# Patient Record
Sex: Female | Born: 1935 | Race: White | Hispanic: No | Smoking: Never smoker
Health system: Southern US, Community
[De-identification: ages and names within clinical notes are randomized; demographics above are authoritative.]

## PROBLEM LIST (undated history)

## (undated) DIAGNOSIS — K224 Dyskinesia of esophagus: Secondary | ICD-10-CM

## (undated) DIAGNOSIS — I341 Nonrheumatic mitral (valve) prolapse: Secondary | ICD-10-CM

## (undated) DIAGNOSIS — I10 Essential (primary) hypertension: Secondary | ICD-10-CM

## (undated) DIAGNOSIS — D369 Benign neoplasm, unspecified site: Secondary | ICD-10-CM

## (undated) DIAGNOSIS — K219 Gastro-esophageal reflux disease without esophagitis: Secondary | ICD-10-CM

## (undated) DIAGNOSIS — M810 Age-related osteoporosis without current pathological fracture: Secondary | ICD-10-CM

## (undated) DIAGNOSIS — H409 Unspecified glaucoma: Secondary | ICD-10-CM

## (undated) DIAGNOSIS — K624 Stenosis of anus and rectum: Secondary | ICD-10-CM

## (undated) DIAGNOSIS — E785 Hyperlipidemia, unspecified: Secondary | ICD-10-CM

## (undated) DIAGNOSIS — K589 Irritable bowel syndrome without diarrhea: Secondary | ICD-10-CM

## (undated) DIAGNOSIS — M069 Rheumatoid arthritis, unspecified: Secondary | ICD-10-CM

## (undated) DIAGNOSIS — K5900 Constipation, unspecified: Secondary | ICD-10-CM

## (undated) DIAGNOSIS — I2510 Atherosclerotic heart disease of native coronary artery without angina pectoris: Secondary | ICD-10-CM

## (undated) HISTORY — DX: Dyskinesia of esophagus: K22.4

## (undated) HISTORY — DX: Nonrheumatic mitral (valve) prolapse: I34.1

## (undated) HISTORY — DX: Essential (primary) hypertension: I10

## (undated) HISTORY — DX: Hyperlipidemia, unspecified: E78.5

## (undated) HISTORY — PX: PARTIAL HYSTERECTOMY: SHX80

## (undated) HISTORY — DX: Atherosclerotic heart disease of native coronary artery without angina pectoris: I25.10

## (undated) HISTORY — DX: Unspecified glaucoma: H40.9

## (undated) HISTORY — DX: Stenosis of anus and rectum: K62.4

## (undated) HISTORY — DX: Gastro-esophageal reflux disease without esophagitis: K21.9

## (undated) HISTORY — PX: BREAST CYST EXCISION: SHX579

## (undated) HISTORY — PX: CARDIAC CATHETERIZATION: SHX172

## (undated) HISTORY — DX: Rheumatoid arthritis, unspecified: M06.9

## (undated) HISTORY — DX: Constipation, unspecified: K59.00

## (undated) HISTORY — DX: Age-related osteoporosis without current pathological fracture: M81.0

## (undated) HISTORY — PX: GALLBLADDER SURGERY: SHX652

## (undated) HISTORY — PX: PACEMAKER INSERTION: SHX728

## (undated) HISTORY — DX: Irritable bowel syndrome without diarrhea: K58.9

## (undated) HISTORY — DX: Benign neoplasm, unspecified site: D36.9

---

## 2000-01-31 HISTORY — PX: OTHER SURGICAL HISTORY: SHX169

## 2004-02-25 ENCOUNTER — Ambulatory Visit: Payer: Self-pay | Admitting: Internal Medicine

## 2005-04-20 ENCOUNTER — Ambulatory Visit: Payer: Self-pay | Admitting: Internal Medicine

## 2006-06-08 ENCOUNTER — Ambulatory Visit: Payer: Self-pay | Admitting: Internal Medicine

## 2007-06-19 ENCOUNTER — Ambulatory Visit: Payer: Self-pay | Admitting: Internal Medicine

## 2009-12-29 ENCOUNTER — Ambulatory Visit: Payer: Self-pay | Admitting: Internal Medicine

## 2009-12-30 ENCOUNTER — Ambulatory Visit: Payer: Self-pay | Admitting: Internal Medicine

## 2009-12-30 DIAGNOSIS — K5900 Constipation, unspecified: Secondary | ICD-10-CM | POA: Insufficient documentation

## 2009-12-31 ENCOUNTER — Telehealth (INDEPENDENT_AMBULATORY_CARE_PROVIDER_SITE_OTHER): Payer: Self-pay | Admitting: *Deleted

## 2010-01-03 ENCOUNTER — Encounter: Payer: Self-pay | Admitting: Internal Medicine

## 2010-01-03 DIAGNOSIS — K219 Gastro-esophageal reflux disease without esophagitis: Secondary | ICD-10-CM

## 2010-01-05 ENCOUNTER — Telehealth (INDEPENDENT_AMBULATORY_CARE_PROVIDER_SITE_OTHER): Payer: Self-pay

## 2010-01-11 ENCOUNTER — Ambulatory Visit (HOSPITAL_COMMUNITY)
Admission: RE | Admit: 2010-01-11 | Discharge: 2010-01-11 | Payer: Self-pay | Source: Home / Self Care | Attending: Internal Medicine | Admitting: Internal Medicine

## 2010-01-11 HISTORY — PX: ESOPHAGOGASTRODUODENOSCOPY: SHX1529

## 2010-01-11 HISTORY — PX: COLONOSCOPY: SHX174

## 2010-01-12 ENCOUNTER — Encounter: Payer: Self-pay | Admitting: Internal Medicine

## 2010-01-16 ENCOUNTER — Encounter: Payer: Self-pay | Admitting: Internal Medicine

## 2010-02-02 ENCOUNTER — Encounter: Payer: Self-pay | Admitting: Internal Medicine

## 2010-03-01 NOTE — Letter (Signed)
Summary: OP NOTE  OP NOTE   Imported By: Rexene Alberts 12/29/2009 09:49:45  _____________________________________________________________________  External Attachment:    Type:   Image     Comment:   External Document

## 2010-03-01 NOTE — Assessment & Plan Note (Signed)
Summary: 69YR F/U, GERD/CONSTIPATION/LAW   Visit Type:  Follow-up Visit Primary Care Provider:  howard  Chief Complaint:  1 year follow up- still having reflux sx.  History of Present Illness: Chronic constipation well managed with a cocktail including prune juice applesauce and fiber once the cereal each morning. She takes MiraLax twice daily as well. . This revealed a normal upper GI tract.  Normal screening colonoscopy in 1616.  77 -year-old lady with a history of nutcracker esophagus and GERD, chronic constipation returns to see me with a three-month history of retroxiphoid burning and aching from time to time. Initial episode in August of this year led to an emergency department visit. She had a nuclear stress test and a cardiology evaluation. Chest pain not felt to be secondary to coronary ischemia. She has a history of vasospastic angina. She does have 2 stents. She is on Cardizem. She has been started on low-dose prednisone for rheumatoid arthritis. It is also quite notable that she started on Fosamax around about the time of her chest symptoms started. She's been on this agent for approximately 3 months. She notes more difficulties on "Sunday when she's  in church and afterwards. Please note that she gets up at 2:00 in the morning every Sunday and takes her Fosamax; she does not lie  back down; she does drink a full glass of water. She denies  true odynaphagia, dysphagia early satiety melena or hematochezia.  Preventive Screening-Counseling & Management  Alcohol-Tobacco     Smoking Status: never  Current Medications (verified): 1)  Imdur 60 Mg Xr24h-Tab (Isosorbide Mononitrate) .... Two Times A Day 2)  Metoprolol Tartrate 25 Mg Tabs (Metoprolol Tartrate) .... 1/2 Two Times A Day 3)  Cardizem 30 Mg Tabs (Diltiazem Hcl) .... Two Times A Day 4)  Lisinopril 5 Mg Tabs (Lisinopril) .... Once Daily 5)  Aspirin Ec 325 Mg Tbec (Aspirin) .... Once Daily 6)  Mirapex 0.25 Mg Tabs (Pramipexole  Dihydrochloride) .... Once Daily 7)  Zoloft 50 Mg Tabs (Sertraline Hcl) .... Once Daily 8)  Travatan Z 0.004 % Soln (Travoprost) .... Once Daily 9)  Asopt Eye Drop .... Once Daily 10)  Nitrostat 0.4 Mg Subl (Nitroglycerin) .... As Needed 11)  Meclizine Hcl 12.5 Mg Tabs (Meclizine Hcl) .... Once Daily 12)  Prednisone 5 Mg Tabs (Prednisone) .... Once Daily 13)  Fosamax 70 Mg Tabs (Alendronate Sodium) .... Q Week 14)  Protonix 40 Mg Tbec (Pantoprazole Sodium) .... Once Daily  Allergies (verified): 1)  ! Darvocet 2)  ! Pcn 3)  ! Codeine 4)  ! Amoxicillin 5)  ! Morphine 6)  ! Sulfa 7)  ! Phenergan 8)  ! Antivert (Meclizine Hcl) 9)  ! Benadryl  Past History:  Past Medical History: GERD Glaucoma Hyperlipidemia Hypertension Irritable Bowel Syndrome nutcracker esophagus constipation osteoporosis anal stenosis CAD angina mitral valve prolapse rhumatoid arthritis  Past Surgical History: tcs 2002 pacemaker heart cath gallbladder partial hyst breast cyst  Family History: Father: deceased Mother:deceased   Siblings: 2 deceased brothers 1 living brother, 3 sisters No FH of Colon Cancer:  Social History: Marital Status: widow Children: 2 Occupation: retired Patient has never smoked.  Alcohol Use - no Smoking Status:  never  Vital Signs:  Patient profile:   77 year old female Height:      61 inches Weight:      115 pounds BMI:     21.81 Temp:     97" .9 degrees F oral Pulse rate:   60 / minute BP  sitting:   100 / 64  (left arm) Cuff size:   regular  Vitals Entered By: Hendricks Limes LPN (December 29, 2009 2:02 PM)  Physical Exam  General:  very pleasant well-groomed well oriented conversant 77 year old lady resting comfortably Eyes:  no scleral icterus. Conjunctiva pink Lungs:  clear to auscultation Heart:  regular rate and rhythm without murmur gallop rub Abdomen:  nondistended. Positive bowel sounds soft and nontender without appreciable mass or  organomegaly  Impression & Recommendations: Impression: 77 year old lady with recent retroxiphoid chest discomfort in a  setting of known nutcracker esophagus and history of GERD. Although her recent worsening retroxiphoid symptoms barely predated the initiation of Fosamax therapy, the coincidence is striking. She tends to have more symptoms on the same day she takes Fosamax than any other time. Even though she is not having any true odynophagia or dysphagia, I'm concerned that Fosamax may be causing some of her symptoms. She's really already on treatment for nutcracker esophagus via cardiezem. Her gallbladder is out. I doubt her symptoms are emanating from below the diaphragm.  I do not detect any alarm symptoms at this time.  Recommendations: Stop Fosamax for the next month.  Continue Protonix 40 mg orally daily.  Add Carafate suspension 1 g q.i.d. x7 days  One immunofecal occult blood test on stool.  This nice lady is asked to call me back in one month and let me know how she is doing. If she is markedly improved, then I would plan to switch her to a parenteral bisphosphonate agent. If not, then I'll will recommend a diagnostic EGD.  Further recommendations to follow.  Appended Document: Orders Update    Clinical Lists Changes  Problems: Added new problem of GERD (ICD-530.81) Orders: Added new Service order of Est. Patient Level III ([address]109) - Signed

## 2010-03-01 NOTE — Assessment & Plan Note (Signed)
Summary: DROPPED OFF STOOL/SS  pt returned ifobt and it was positive  Allergies: 1)  ! Darvocet 2)  ! Pcn 3)  ! Codeine 4)  ! Amoxicillin 5)  ! Morphine 6)  ! Sulfa 7)  ! Phenergan 8)  ! Antivert (Meclizine Hcl) 9)  ! Benadryl  Other Orders: Immuno-chemical Fecal Occult (04540)

## 2010-03-01 NOTE — Letter (Signed)
Summary: TCS/EGD ORDER  TCS/EGD ORDER   Imported By: Ave Filter 01/03/2010 10:13:22  _____________________________________________________________________  External Attachment:    Type:   Image     Comment:   External Document

## 2010-03-01 NOTE — Progress Notes (Signed)
  Phone Note Call from Patient   Reason for Call: Talk to Nurse, Lab or Test Results Summary of Call: Patient has called numerous times wanting results. Please return her call ASAP on Monday morning. Initial call taken by: Diana Eves,  December 31, 2009 4:32 PM     Appended Document:  occult blood positive; needs a tcs now and possibly an EGD to follow  Appended Document:  pt aware  Appended Document:  Pt is scheduled for procedure 01/03/10@11 :15a.m.Pt aware of appt.

## 2010-03-01 NOTE — Progress Notes (Signed)
Summary: progress report from pt  Phone Note Call from Patient Call back at [facility] Phone [phone]   Caller: Patient Summary of Call: pt called with PR- she stated she is feeling better- on a scale of 1-10 she feels like a 9 or 9.5. Today is her last day of taking Carafate. She is scheduled for tcs next tuesday. Initial call taken by: Hendricks Limes LPN,  January 05, 2010 12:26 PM     Appended Document: progress report from pt noted

## 2010-03-03 NOTE — Letter (Signed)
Summary: CLINIC NOTE FROM [facility]  CLINIC NOTE FROM [facility]   Imported By: Rexene Alberts 02/02/2010 09:53:16  _____________________________________________________________________  External Attachment:    Type:   Image     Comment:   External Document

## 2010-03-03 NOTE — Letter (Signed)
Summary: CLINIC NOTE FROM DR [facility] NOTE FROM DR Kellie Simmering   Imported By: Rexene Alberts 01/12/2010 09:50:57  _____________________________________________________________________  External Attachment:    Type:   Image     Comment:   External Document

## 2010-03-03 NOTE — Letter (Signed)
Summary: Patient Notice, Endo Biopsy Results  [facility]  [address]   [place], [place] [address]   Phone: [phone]  Fax: [phone]       January 16, 2010   LUCRETIA PENDLEY [address] [place], [place]  [address] [birth year]    Dear Ms. Beadle,  I am pleased to inform you that the biopsies taken during your recent endoscopic examination did not show any evidence of cancer upon pathologic examination. There was some mild inflammation in your stomach.  The poyp removed from your colon was also benign.  Additional information/recommendations:  Continue with the treatment plan as outlined on the day of your exam.  Please call us if you are having persistent problems or have questions about your condition that have not been fully answered at this time.  Have a Merry Christmas.  Sincerely,    R. Roetta Sessions MD, FACP [facility] Ph: [phone]   Fax: [phone]   Appended Document: Patient Notice, Endo Biopsy Results letter mailed to pt

## 2010-04-12 ENCOUNTER — Encounter: Payer: Self-pay | Admitting: Internal Medicine

## 2010-04-13 ENCOUNTER — Encounter (INDEPENDENT_AMBULATORY_CARE_PROVIDER_SITE_OTHER): Payer: Self-pay | Admitting: *Deleted

## 2010-04-19 NOTE — Medication Information (Signed)
Summary: CARAFATE  CARAFATE   Imported By: Rexene Alberts 04/12/2010 14:05:52  _____________________________________________________________________  External Attachment:    Type:   Image     Comment:   External Document

## 2010-04-19 NOTE — Letter (Signed)
Summary: Recall Office Visit  [facility]  [address]   [place], [place] [address]   Phone: [phone]  Fax: [phone]      April 13, 2010   Deanna LECOMPTE [address] [place], [place]  [address] Jan 19, 1923   Dear Ms. Hamada,   According to our records, it is time for you to schedule a follow-up office visit with [place].   At your convenience, please call [phone] to schedule an office visit. If you have any questions, concerns, or feel that this letter is in error, we would appreciate your call.   Sincerely,    Diana Eves  [facility] Ph: [phone]   Fax: [phone]

## 2010-06-14 NOTE — Assessment & Plan Note (Signed)
NAMEEMMETT, ARNTZ                 CHART#:  [id number]   DATE:  06/19/2007                       DOB:  1922/08/12   REASON FOR VISIT:  Nutcracker esophagus, GERD, constipation.  Last seen  Jun 08, 2006.   HISTORY:  Ms. Kapler has done very well from a GI standpoint over the  past 1 year.  Reflux symptoms well controlled on Protonix 40 mg orally  daily.  She describes her appetite as being very good.  She is not  having any dysphagia.  She has gained 4.5 pounds since she was last seen  1 year ago.  She moves her bowels 2-3 times daily with the help of  Colace and MiraLax q.h.s. on a p.r.n. basis.  She tells me in August she  developed chest pain and was diagnosed with acute coronary syndrome and  was transferred from [facility] ED to  [facility] where she spent a couple  days and describes having a dobutamine echo.  She tells me that she did  not have a heart attack.  She did not require any percutaneous  intervention and she has done well since that time.  She describes  things going very well for her.  She is very active.  She gets up every  morning and goes to [facility] even at 5 a.m. and walks the perimeter of  the store 6 times and browses for an hour before she goes home and  starts the rest of her day   I note her last colonoscopy was in 2002.  She had some anal stenosis and  melanosis coli only.  There is no family history of GI neoplasia.   CURRENT MEDICATIONS:  See updated list.   ALLERGIES:  1. DARVOCET.  2. PENICILLIN.  3. CODEINE.  4. AMOXICILLIN.  5. MORPHINE.  6. SULFA.  7. PHENERGAN.  8. ANTIVERT.  9. BENADRYL.   PHYSICAL EXAMINATION:  GENERAL:  On examination today, she looks very  well.  VITAL SIGNS:  Weight 110.5, height 5 feet 1 inch, temperature 97.9, BP  112/78, pulse 72.  SKIN:  Warm and dry.  ABDOMEN:  Flat, positive bowel sounds, soft, nontender.  No appreciable  mass or organomegaly.   ASSESSMENT:  1. Esophageal motility disorder/nutcracker,  gastroesophageal reflux      disease.  Symptoms quiesced on Protonix 40 mg orally daily.  2. Constipation, not an active problem now with MiraLax and stool      softeners.   RECOMMENDATIONS:  1. Continue Protonix 40 mg orally daily indefinitely.  2. Continue her bowel regimen which she is currently on unless      something comes up.  3. I plan to see this nice lady back in 1 year.       Jonathon Bellows, M.D.  Electronically Signed     RMR/MEDQ  D:  06/19/2007  T:  06/19/2007  Job:  [id number]   cc:   Selinda Flavin

## 2010-06-14 NOTE — Assessment & Plan Note (Signed)
Deanna Villegas, Deanna Villegas                 CHART#:  [id number]   DATE:  06/08/2006                       DOB:  Nov 04, 1922   GERD/esophageal motility disorder, non-cardiac chest pain, constipation  last seen 1 year ago.  Overall, Deanna Villegas has done extremely well on  Nexium 40 mg orally daily.  She dropped back to every other day, but  that was not satisfactory.  She is also taking Librax q.i.d. She is  occasionally constipated, takes GlycoLax and stool softeners and does  very well.  She is really not having any dysphagia currently.  She has  lost 5 lb, but is trying to lose a little weight.  Has not had any  nausea or vomiting or food impaction clinically, no melena or rectal  bleeding.  Last colonoscopy 2002 demonstrated melanosis coli.  Overall,  it seems she is doing very well and she tells me that osteoporosis  therapy is being considered and she is worried about what the pills  might do to her esophagus.   MEDICATIONS:  See updated listing.   ALLERGIES:  DARVOCET, PENICILLIN, CODEINE, AMOXICILLIN, PHENERGAN,  MORPHINE SULFA, ANTIVERT, BENADRYL.   PHYSICAL EXAMINATION:  GENERAL:  Overall this lady appears at her  baseline.  She is pleasant and appears well today.  VITAL SIGNS:  Weight 106, height 5 feet 1 inches, temperature 97, blood  pressure 114/80, pulse 50.  CHEST:  Lungs are clear to auscultation.  COR:  Regular rate and rhythm without murmurs, gallops or rubs.  ABDOMEN:  Nondistended.  Positive bowel sounds.  Soft and nontender  without appreciable mass or hepatosplenomegaly.   ASSESSMENT:  1. Esophageal motility disorder/gastroesophageal reflux disease.      Symptoms quiescent on Nexium 40 mg orally daily.  2. Constipation, being well controlled on GlycoLax and stool      softeners.   RECOMMENDATIONS:  1. I feel [name] ought to stay on Nexium 40 mg orally daily      indefinitely.  Theoretical risks of this medication are far      outweighed by the clinical benefit  we are seeing.  2. If alendronate therapy  is contemplated would prefer her to be on      an injectable agent.  3. Unless something comes up, I plan to see this nice lady back in 1      year.  Prescription samples for Nexium given.       Jonathon Bellows, M.D.  Electronically Signed     RMR/MEDQ  D:  06/08/2006  T:  06/08/2006  Job:  [id number]   cc:   Selinda Flavin

## 2011-01-09 DIAGNOSIS — E785 Hyperlipidemia, unspecified: Secondary | ICD-10-CM | POA: Insufficient documentation

## 2011-01-09 DIAGNOSIS — I10 Essential (primary) hypertension: Secondary | ICD-10-CM | POA: Insufficient documentation

## 2011-01-09 DIAGNOSIS — Z950 Presence of cardiac pacemaker: Secondary | ICD-10-CM | POA: Insufficient documentation

## 2011-01-09 DIAGNOSIS — I771 Stricture of artery: Secondary | ICD-10-CM | POA: Insufficient documentation

## 2011-01-09 DIAGNOSIS — I2510 Atherosclerotic heart disease of native coronary artery without angina pectoris: Secondary | ICD-10-CM | POA: Insufficient documentation

## 2011-02-01 DIAGNOSIS — J438 Other emphysema: Secondary | ICD-10-CM | POA: Diagnosis not present

## 2011-02-03 DIAGNOSIS — M542 Cervicalgia: Secondary | ICD-10-CM | POA: Diagnosis not present

## 2011-02-03 DIAGNOSIS — M545 Low back pain: Secondary | ICD-10-CM | POA: Diagnosis not present

## 2011-02-06 DIAGNOSIS — M542 Cervicalgia: Secondary | ICD-10-CM | POA: Diagnosis not present

## 2011-02-06 DIAGNOSIS — M545 Low back pain: Secondary | ICD-10-CM | POA: Diagnosis not present

## 2011-02-08 DIAGNOSIS — M545 Low back pain: Secondary | ICD-10-CM | POA: Diagnosis not present

## 2011-02-08 DIAGNOSIS — M542 Cervicalgia: Secondary | ICD-10-CM | POA: Diagnosis not present

## 2011-02-10 DIAGNOSIS — M545 Low back pain: Secondary | ICD-10-CM | POA: Diagnosis not present

## 2011-02-10 DIAGNOSIS — M542 Cervicalgia: Secondary | ICD-10-CM | POA: Diagnosis not present

## 2011-02-14 DIAGNOSIS — I10 Essential (primary) hypertension: Secondary | ICD-10-CM | POA: Diagnosis not present

## 2011-02-14 DIAGNOSIS — E7800 Pure hypercholesterolemia, unspecified: Secondary | ICD-10-CM | POA: Diagnosis not present

## 2011-02-15 DIAGNOSIS — M542 Cervicalgia: Secondary | ICD-10-CM | POA: Diagnosis not present

## 2011-02-15 DIAGNOSIS — M545 Low back pain: Secondary | ICD-10-CM | POA: Diagnosis not present

## 2011-02-17 DIAGNOSIS — M542 Cervicalgia: Secondary | ICD-10-CM | POA: Diagnosis not present

## 2011-02-17 DIAGNOSIS — M545 Low back pain: Secondary | ICD-10-CM | POA: Diagnosis not present

## 2011-02-20 DIAGNOSIS — M542 Cervicalgia: Secondary | ICD-10-CM | POA: Diagnosis not present

## 2011-02-20 DIAGNOSIS — M545 Low back pain: Secondary | ICD-10-CM | POA: Diagnosis not present

## 2011-02-21 DIAGNOSIS — I209 Angina pectoris, unspecified: Secondary | ICD-10-CM | POA: Diagnosis not present

## 2011-02-21 DIAGNOSIS — K219 Gastro-esophageal reflux disease without esophagitis: Secondary | ICD-10-CM | POA: Diagnosis not present

## 2011-02-21 DIAGNOSIS — J438 Other emphysema: Secondary | ICD-10-CM | POA: Diagnosis not present

## 2011-02-21 DIAGNOSIS — E7800 Pure hypercholesterolemia, unspecified: Secondary | ICD-10-CM | POA: Diagnosis not present

## 2011-02-21 DIAGNOSIS — I10 Essential (primary) hypertension: Secondary | ICD-10-CM | POA: Diagnosis not present

## 2011-02-21 DIAGNOSIS — F329 Major depressive disorder, single episode, unspecified: Secondary | ICD-10-CM | POA: Diagnosis not present

## 2011-02-21 DIAGNOSIS — M129 Arthropathy, unspecified: Secondary | ICD-10-CM | POA: Diagnosis not present

## 2011-02-23 DIAGNOSIS — M545 Low back pain: Secondary | ICD-10-CM | POA: Diagnosis not present

## 2011-02-23 DIAGNOSIS — M542 Cervicalgia: Secondary | ICD-10-CM | POA: Diagnosis not present

## 2011-03-01 DIAGNOSIS — M542 Cervicalgia: Secondary | ICD-10-CM | POA: Diagnosis not present

## 2011-03-01 DIAGNOSIS — M545 Low back pain: Secondary | ICD-10-CM | POA: Diagnosis not present

## 2011-03-06 DIAGNOSIS — M542 Cervicalgia: Secondary | ICD-10-CM | POA: Diagnosis not present

## 2011-03-06 DIAGNOSIS — M545 Low back pain: Secondary | ICD-10-CM | POA: Diagnosis not present

## 2011-03-12 DIAGNOSIS — H409 Unspecified glaucoma: Secondary | ICD-10-CM | POA: Insufficient documentation

## 2011-03-12 DIAGNOSIS — H401190 Primary open-angle glaucoma, unspecified eye, stage unspecified: Secondary | ICD-10-CM | POA: Insufficient documentation

## 2011-03-13 DIAGNOSIS — H4011X0 Primary open-angle glaucoma, stage unspecified: Secondary | ICD-10-CM | POA: Diagnosis not present

## 2011-03-13 DIAGNOSIS — H409 Unspecified glaucoma: Secondary | ICD-10-CM | POA: Diagnosis not present

## 2011-03-22 DIAGNOSIS — I10 Essential (primary) hypertension: Secondary | ICD-10-CM | POA: Diagnosis not present

## 2011-03-23 DIAGNOSIS — I442 Atrioventricular block, complete: Secondary | ICD-10-CM | POA: Diagnosis not present

## 2011-04-24 DIAGNOSIS — Z79899 Other long term (current) drug therapy: Secondary | ICD-10-CM | POA: Diagnosis not present

## 2011-04-24 DIAGNOSIS — M542 Cervicalgia: Secondary | ICD-10-CM | POA: Diagnosis not present

## 2011-04-24 DIAGNOSIS — M25519 Pain in unspecified shoulder: Secondary | ICD-10-CM | POA: Diagnosis not present

## 2011-04-28 DIAGNOSIS — H4011X0 Primary open-angle glaucoma, stage unspecified: Secondary | ICD-10-CM | POA: Diagnosis not present

## 2011-05-15 DIAGNOSIS — Z79899 Other long term (current) drug therapy: Secondary | ICD-10-CM | POA: Diagnosis not present

## 2011-05-15 DIAGNOSIS — M899 Disorder of bone, unspecified: Secondary | ICD-10-CM | POA: Diagnosis not present

## 2011-05-15 DIAGNOSIS — M949 Disorder of cartilage, unspecified: Secondary | ICD-10-CM | POA: Diagnosis not present

## 2011-05-15 DIAGNOSIS — M542 Cervicalgia: Secondary | ICD-10-CM | POA: Diagnosis not present

## 2011-05-15 DIAGNOSIS — M353 Polymyalgia rheumatica: Secondary | ICD-10-CM | POA: Diagnosis not present

## 2011-05-17 DIAGNOSIS — H4011X0 Primary open-angle glaucoma, stage unspecified: Secondary | ICD-10-CM | POA: Diagnosis not present

## 2011-05-17 DIAGNOSIS — H409 Unspecified glaucoma: Secondary | ICD-10-CM | POA: Diagnosis not present

## 2011-05-18 DIAGNOSIS — M129 Arthropathy, unspecified: Secondary | ICD-10-CM | POA: Diagnosis not present

## 2011-05-18 DIAGNOSIS — F329 Major depressive disorder, single episode, unspecified: Secondary | ICD-10-CM | POA: Diagnosis not present

## 2011-05-18 DIAGNOSIS — I209 Angina pectoris, unspecified: Secondary | ICD-10-CM | POA: Diagnosis not present

## 2011-05-18 DIAGNOSIS — K219 Gastro-esophageal reflux disease without esophagitis: Secondary | ICD-10-CM | POA: Diagnosis not present

## 2011-05-18 DIAGNOSIS — J438 Other emphysema: Secondary | ICD-10-CM | POA: Diagnosis not present

## 2011-05-18 DIAGNOSIS — I10 Essential (primary) hypertension: Secondary | ICD-10-CM | POA: Diagnosis not present

## 2011-05-18 DIAGNOSIS — M353 Polymyalgia rheumatica: Secondary | ICD-10-CM | POA: Diagnosis not present

## 2011-05-18 DIAGNOSIS — E7800 Pure hypercholesterolemia, unspecified: Secondary | ICD-10-CM | POA: Diagnosis not present

## 2011-05-24 DIAGNOSIS — H02059 Trichiasis without entropian unspecified eye, unspecified eyelid: Secondary | ICD-10-CM | POA: Diagnosis not present

## 2011-06-07 DIAGNOSIS — IMO0002 Reserved for concepts with insufficient information to code with codable children: Secondary | ICD-10-CM | POA: Diagnosis not present

## 2011-06-07 DIAGNOSIS — M129 Arthropathy, unspecified: Secondary | ICD-10-CM | POA: Diagnosis not present

## 2011-06-22 DIAGNOSIS — I442 Atrioventricular block, complete: Secondary | ICD-10-CM | POA: Diagnosis not present

## 2011-06-22 DIAGNOSIS — Z45018 Encounter for adjustment and management of other part of cardiac pacemaker: Secondary | ICD-10-CM | POA: Diagnosis not present

## 2011-06-29 DIAGNOSIS — H409 Unspecified glaucoma: Secondary | ICD-10-CM | POA: Diagnosis not present

## 2011-06-29 DIAGNOSIS — H4011X0 Primary open-angle glaucoma, stage unspecified: Secondary | ICD-10-CM | POA: Diagnosis not present

## 2011-07-10 DIAGNOSIS — Z880 Allergy status to penicillin: Secondary | ICD-10-CM | POA: Diagnosis not present

## 2011-07-10 DIAGNOSIS — Z882 Allergy status to sulfonamides status: Secondary | ICD-10-CM | POA: Diagnosis not present

## 2011-07-10 DIAGNOSIS — Z888 Allergy status to other drugs, medicaments and biological substances status: Secondary | ICD-10-CM | POA: Diagnosis not present

## 2011-07-10 DIAGNOSIS — Z883 Allergy status to other anti-infective agents status: Secondary | ICD-10-CM | POA: Diagnosis not present

## 2011-07-10 DIAGNOSIS — Z950 Presence of cardiac pacemaker: Secondary | ICD-10-CM | POA: Diagnosis not present

## 2011-07-10 DIAGNOSIS — I509 Heart failure, unspecified: Secondary | ICD-10-CM | POA: Diagnosis not present

## 2011-07-10 DIAGNOSIS — Z9861 Coronary angioplasty status: Secondary | ICD-10-CM | POA: Diagnosis not present

## 2011-07-10 DIAGNOSIS — R079 Chest pain, unspecified: Secondary | ICD-10-CM | POA: Diagnosis not present

## 2011-07-10 DIAGNOSIS — I2510 Atherosclerotic heart disease of native coronary artery without angina pectoris: Secondary | ICD-10-CM | POA: Diagnosis not present

## 2011-07-10 DIAGNOSIS — K219 Gastro-esophageal reflux disease without esophagitis: Secondary | ICD-10-CM | POA: Diagnosis not present

## 2011-07-10 DIAGNOSIS — Z79899 Other long term (current) drug therapy: Secondary | ICD-10-CM | POA: Diagnosis not present

## 2011-07-10 DIAGNOSIS — I442 Atrioventricular block, complete: Secondary | ICD-10-CM | POA: Diagnosis not present

## 2011-07-10 DIAGNOSIS — Z9109 Other allergy status, other than to drugs and biological substances: Secondary | ICD-10-CM | POA: Diagnosis not present

## 2011-07-10 DIAGNOSIS — Z45018 Encounter for adjustment and management of other part of cardiac pacemaker: Secondary | ICD-10-CM | POA: Diagnosis not present

## 2011-07-10 DIAGNOSIS — E7800 Pure hypercholesterolemia, unspecified: Secondary | ICD-10-CM | POA: Diagnosis not present

## 2011-07-10 DIAGNOSIS — Z7982 Long term (current) use of aspirin: Secondary | ICD-10-CM | POA: Diagnosis not present

## 2011-07-10 DIAGNOSIS — I10 Essential (primary) hypertension: Secondary | ICD-10-CM | POA: Diagnosis not present

## 2011-07-10 DIAGNOSIS — Z885 Allergy status to narcotic agent status: Secondary | ICD-10-CM | POA: Diagnosis not present

## 2011-07-17 DIAGNOSIS — H4011X0 Primary open-angle glaucoma, stage unspecified: Secondary | ICD-10-CM | POA: Diagnosis not present

## 2011-07-17 DIAGNOSIS — H409 Unspecified glaucoma: Secondary | ICD-10-CM | POA: Diagnosis not present

## 2011-07-21 DIAGNOSIS — Z1231 Encounter for screening mammogram for malignant neoplasm of breast: Secondary | ICD-10-CM | POA: Diagnosis not present

## 2011-07-24 DIAGNOSIS — Z79899 Other long term (current) drug therapy: Secondary | ICD-10-CM | POA: Diagnosis not present

## 2011-07-24 DIAGNOSIS — M542 Cervicalgia: Secondary | ICD-10-CM | POA: Diagnosis not present

## 2011-07-24 DIAGNOSIS — M353 Polymyalgia rheumatica: Secondary | ICD-10-CM | POA: Diagnosis not present

## 2011-07-24 DIAGNOSIS — M810 Age-related osteoporosis without current pathological fracture: Secondary | ICD-10-CM | POA: Diagnosis not present

## 2011-07-24 DIAGNOSIS — L570 Actinic keratosis: Secondary | ICD-10-CM | POA: Diagnosis not present

## 2011-07-24 DIAGNOSIS — Z85828 Personal history of other malignant neoplasm of skin: Secondary | ICD-10-CM | POA: Diagnosis not present

## 2011-08-01 DIAGNOSIS — I10 Essential (primary) hypertension: Secondary | ICD-10-CM | POA: Diagnosis not present

## 2011-08-01 DIAGNOSIS — E7800 Pure hypercholesterolemia, unspecified: Secondary | ICD-10-CM | POA: Diagnosis not present

## 2011-08-09 DIAGNOSIS — I10 Essential (primary) hypertension: Secondary | ICD-10-CM | POA: Diagnosis not present

## 2011-08-09 DIAGNOSIS — M353 Polymyalgia rheumatica: Secondary | ICD-10-CM | POA: Diagnosis not present

## 2011-08-09 DIAGNOSIS — E7800 Pure hypercholesterolemia, unspecified: Secondary | ICD-10-CM | POA: Diagnosis not present

## 2011-08-09 DIAGNOSIS — G4700 Insomnia, unspecified: Secondary | ICD-10-CM | POA: Diagnosis not present

## 2011-09-12 DIAGNOSIS — L259 Unspecified contact dermatitis, unspecified cause: Secondary | ICD-10-CM | POA: Diagnosis not present

## 2011-09-12 DIAGNOSIS — F411 Generalized anxiety disorder: Secondary | ICD-10-CM | POA: Diagnosis not present

## 2011-09-21 DIAGNOSIS — I442 Atrioventricular block, complete: Secondary | ICD-10-CM | POA: Diagnosis not present

## 2011-09-28 DIAGNOSIS — H60399 Other infective otitis externa, unspecified ear: Secondary | ICD-10-CM | POA: Diagnosis not present

## 2011-10-06 DIAGNOSIS — R5383 Other fatigue: Secondary | ICD-10-CM | POA: Diagnosis not present

## 2011-10-06 DIAGNOSIS — E538 Deficiency of other specified B group vitamins: Secondary | ICD-10-CM | POA: Diagnosis not present

## 2011-10-09 DIAGNOSIS — R5381 Other malaise: Secondary | ICD-10-CM | POA: Diagnosis not present

## 2011-10-16 DIAGNOSIS — M67919 Unspecified disorder of synovium and tendon, unspecified shoulder: Secondary | ICD-10-CM | POA: Diagnosis not present

## 2011-10-16 DIAGNOSIS — M545 Low back pain: Secondary | ICD-10-CM | POA: Diagnosis not present

## 2011-10-16 DIAGNOSIS — M719 Bursopathy, unspecified: Secondary | ICD-10-CM | POA: Diagnosis not present

## 2011-10-16 DIAGNOSIS — M353 Polymyalgia rheumatica: Secondary | ICD-10-CM | POA: Diagnosis not present

## 2011-10-18 DIAGNOSIS — Z85828 Personal history of other malignant neoplasm of skin: Secondary | ICD-10-CM | POA: Diagnosis not present

## 2011-10-18 DIAGNOSIS — L570 Actinic keratosis: Secondary | ICD-10-CM | POA: Diagnosis not present

## 2011-10-18 DIAGNOSIS — Z23 Encounter for immunization: Secondary | ICD-10-CM | POA: Diagnosis not present

## 2011-10-20 DIAGNOSIS — J449 Chronic obstructive pulmonary disease, unspecified: Secondary | ICD-10-CM | POA: Diagnosis not present

## 2011-11-09 DIAGNOSIS — H903 Sensorineural hearing loss, bilateral: Secondary | ICD-10-CM | POA: Diagnosis not present

## 2011-11-13 DIAGNOSIS — H409 Unspecified glaucoma: Secondary | ICD-10-CM | POA: Diagnosis not present

## 2011-11-13 DIAGNOSIS — H4011X0 Primary open-angle glaucoma, stage unspecified: Secondary | ICD-10-CM | POA: Diagnosis not present

## 2011-11-20 DIAGNOSIS — E039 Hypothyroidism, unspecified: Secondary | ICD-10-CM | POA: Diagnosis not present

## 2011-11-21 DIAGNOSIS — E7800 Pure hypercholesterolemia, unspecified: Secondary | ICD-10-CM | POA: Diagnosis not present

## 2011-11-21 DIAGNOSIS — I10 Essential (primary) hypertension: Secondary | ICD-10-CM | POA: Diagnosis not present

## 2011-11-21 DIAGNOSIS — M129 Arthropathy, unspecified: Secondary | ICD-10-CM | POA: Diagnosis not present

## 2011-11-21 DIAGNOSIS — M353 Polymyalgia rheumatica: Secondary | ICD-10-CM | POA: Diagnosis not present

## 2011-11-21 DIAGNOSIS — I209 Angina pectoris, unspecified: Secondary | ICD-10-CM | POA: Diagnosis not present

## 2011-12-20 DIAGNOSIS — Z85828 Personal history of other malignant neoplasm of skin: Secondary | ICD-10-CM | POA: Diagnosis not present

## 2011-12-20 DIAGNOSIS — L570 Actinic keratosis: Secondary | ICD-10-CM | POA: Diagnosis not present

## 2011-12-20 DIAGNOSIS — L03019 Cellulitis of unspecified finger: Secondary | ICD-10-CM | POA: Diagnosis not present

## 2011-12-21 DIAGNOSIS — I442 Atrioventricular block, complete: Secondary | ICD-10-CM | POA: Diagnosis not present

## 2012-01-08 DIAGNOSIS — I2510 Atherosclerotic heart disease of native coronary artery without angina pectoris: Secondary | ICD-10-CM | POA: Diagnosis not present

## 2012-01-09 DIAGNOSIS — H02059 Trichiasis without entropian unspecified eye, unspecified eyelid: Secondary | ICD-10-CM | POA: Diagnosis not present

## 2012-01-15 DIAGNOSIS — K219 Gastro-esophageal reflux disease without esophagitis: Secondary | ICD-10-CM | POA: Diagnosis not present

## 2012-01-15 DIAGNOSIS — I10 Essential (primary) hypertension: Secondary | ICD-10-CM | POA: Diagnosis not present

## 2012-01-15 DIAGNOSIS — M129 Arthropathy, unspecified: Secondary | ICD-10-CM | POA: Diagnosis not present

## 2012-01-15 DIAGNOSIS — E7800 Pure hypercholesterolemia, unspecified: Secondary | ICD-10-CM | POA: Diagnosis not present

## 2012-01-15 DIAGNOSIS — H4011X0 Primary open-angle glaucoma, stage unspecified: Secondary | ICD-10-CM | POA: Diagnosis not present

## 2012-01-18 DIAGNOSIS — M719 Bursopathy, unspecified: Secondary | ICD-10-CM | POA: Diagnosis not present

## 2012-01-18 DIAGNOSIS — I209 Angina pectoris, unspecified: Secondary | ICD-10-CM | POA: Diagnosis not present

## 2012-01-18 DIAGNOSIS — M67919 Unspecified disorder of synovium and tendon, unspecified shoulder: Secondary | ICD-10-CM | POA: Diagnosis not present

## 2012-01-18 DIAGNOSIS — M129 Arthropathy, unspecified: Secondary | ICD-10-CM | POA: Diagnosis not present

## 2012-01-18 DIAGNOSIS — M353 Polymyalgia rheumatica: Secondary | ICD-10-CM | POA: Diagnosis not present

## 2012-01-18 DIAGNOSIS — E039 Hypothyroidism, unspecified: Secondary | ICD-10-CM | POA: Diagnosis not present

## 2012-01-18 DIAGNOSIS — Z79899 Other long term (current) drug therapy: Secondary | ICD-10-CM | POA: Diagnosis not present

## 2012-01-18 DIAGNOSIS — E7800 Pure hypercholesterolemia, unspecified: Secondary | ICD-10-CM | POA: Diagnosis not present

## 2012-01-18 DIAGNOSIS — M545 Low back pain: Secondary | ICD-10-CM | POA: Diagnosis not present

## 2012-01-18 DIAGNOSIS — I10 Essential (primary) hypertension: Secondary | ICD-10-CM | POA: Diagnosis not present

## 2012-02-12 DIAGNOSIS — H409 Unspecified glaucoma: Secondary | ICD-10-CM | POA: Diagnosis not present

## 2012-02-12 DIAGNOSIS — H4011X0 Primary open-angle glaucoma, stage unspecified: Secondary | ICD-10-CM | POA: Diagnosis not present

## 2012-02-27 DIAGNOSIS — H4011X0 Primary open-angle glaucoma, stage unspecified: Secondary | ICD-10-CM | POA: Diagnosis not present

## 2012-02-27 DIAGNOSIS — H409 Unspecified glaucoma: Secondary | ICD-10-CM | POA: Diagnosis not present

## 2012-02-27 DIAGNOSIS — E785 Hyperlipidemia, unspecified: Secondary | ICD-10-CM | POA: Diagnosis not present

## 2012-02-27 DIAGNOSIS — I2510 Atherosclerotic heart disease of native coronary artery without angina pectoris: Secondary | ICD-10-CM | POA: Diagnosis not present

## 2012-02-27 DIAGNOSIS — I10 Essential (primary) hypertension: Secondary | ICD-10-CM | POA: Diagnosis not present

## 2012-02-27 DIAGNOSIS — Z45018 Encounter for adjustment and management of other part of cardiac pacemaker: Secondary | ICD-10-CM | POA: Diagnosis not present

## 2012-02-27 DIAGNOSIS — Z45010 Encounter for checking and testing of cardiac pacemaker pulse generator [battery]: Secondary | ICD-10-CM | POA: Insufficient documentation

## 2012-02-28 DIAGNOSIS — Z45018 Encounter for adjustment and management of other part of cardiac pacemaker: Secondary | ICD-10-CM | POA: Diagnosis not present

## 2012-02-28 DIAGNOSIS — E785 Hyperlipidemia, unspecified: Secondary | ICD-10-CM | POA: Diagnosis not present

## 2012-02-28 DIAGNOSIS — H409 Unspecified glaucoma: Secondary | ICD-10-CM | POA: Diagnosis not present

## 2012-02-28 DIAGNOSIS — I2510 Atherosclerotic heart disease of native coronary artery without angina pectoris: Secondary | ICD-10-CM | POA: Diagnosis not present

## 2012-02-28 DIAGNOSIS — I10 Essential (primary) hypertension: Secondary | ICD-10-CM | POA: Diagnosis not present

## 2012-02-28 DIAGNOSIS — H4011X0 Primary open-angle glaucoma, stage unspecified: Secondary | ICD-10-CM | POA: Diagnosis not present

## 2012-02-29 DIAGNOSIS — I2510 Atherosclerotic heart disease of native coronary artery without angina pectoris: Secondary | ICD-10-CM | POA: Diagnosis not present

## 2012-02-29 DIAGNOSIS — I10 Essential (primary) hypertension: Secondary | ICD-10-CM | POA: Diagnosis not present

## 2012-02-29 DIAGNOSIS — Z45018 Encounter for adjustment and management of other part of cardiac pacemaker: Secondary | ICD-10-CM | POA: Diagnosis not present

## 2012-02-29 DIAGNOSIS — E785 Hyperlipidemia, unspecified: Secondary | ICD-10-CM | POA: Diagnosis not present

## 2012-02-29 DIAGNOSIS — H4011X0 Primary open-angle glaucoma, stage unspecified: Secondary | ICD-10-CM | POA: Diagnosis not present

## 2012-02-29 DIAGNOSIS — H409 Unspecified glaucoma: Secondary | ICD-10-CM | POA: Diagnosis not present

## 2012-03-18 DIAGNOSIS — E039 Hypothyroidism, unspecified: Secondary | ICD-10-CM | POA: Diagnosis not present

## 2012-04-19 DIAGNOSIS — R079 Chest pain, unspecified: Secondary | ICD-10-CM | POA: Diagnosis not present

## 2012-04-19 DIAGNOSIS — Z79899 Other long term (current) drug therapy: Secondary | ICD-10-CM | POA: Diagnosis not present

## 2012-04-19 DIAGNOSIS — I10 Essential (primary) hypertension: Secondary | ICD-10-CM | POA: Diagnosis not present

## 2012-04-19 DIAGNOSIS — I442 Atrioventricular block, complete: Secondary | ICD-10-CM | POA: Diagnosis not present

## 2012-04-19 DIAGNOSIS — Z950 Presence of cardiac pacemaker: Secondary | ICD-10-CM | POA: Diagnosis not present

## 2012-04-19 DIAGNOSIS — Z45018 Encounter for adjustment and management of other part of cardiac pacemaker: Secondary | ICD-10-CM | POA: Diagnosis not present

## 2012-04-19 DIAGNOSIS — Z7982 Long term (current) use of aspirin: Secondary | ICD-10-CM | POA: Diagnosis not present

## 2012-04-19 DIAGNOSIS — R0602 Shortness of breath: Secondary | ICD-10-CM | POA: Diagnosis not present

## 2012-04-19 DIAGNOSIS — J449 Chronic obstructive pulmonary disease, unspecified: Secondary | ICD-10-CM | POA: Diagnosis not present

## 2012-04-24 DIAGNOSIS — J438 Other emphysema: Secondary | ICD-10-CM | POA: Diagnosis not present

## 2012-04-24 DIAGNOSIS — R05 Cough: Secondary | ICD-10-CM | POA: Diagnosis not present

## 2012-04-25 DIAGNOSIS — H4011X0 Primary open-angle glaucoma, stage unspecified: Secondary | ICD-10-CM | POA: Diagnosis not present

## 2012-05-09 DIAGNOSIS — M545 Low back pain: Secondary | ICD-10-CM | POA: Diagnosis not present

## 2012-05-09 DIAGNOSIS — Z79899 Other long term (current) drug therapy: Secondary | ICD-10-CM | POA: Diagnosis not present

## 2012-05-09 DIAGNOSIS — M353 Polymyalgia rheumatica: Secondary | ICD-10-CM | POA: Diagnosis not present

## 2012-05-10 DIAGNOSIS — J449 Chronic obstructive pulmonary disease, unspecified: Secondary | ICD-10-CM | POA: Diagnosis not present

## 2012-06-11 ENCOUNTER — Encounter: Payer: Self-pay | Admitting: Cardiology

## 2012-06-11 ENCOUNTER — Institutional Professional Consult (permissible substitution): Payer: Self-pay | Admitting: Cardiovascular Disease

## 2012-06-19 DIAGNOSIS — Z85828 Personal history of other malignant neoplasm of skin: Secondary | ICD-10-CM | POA: Diagnosis not present

## 2012-06-19 DIAGNOSIS — L03019 Cellulitis of unspecified finger: Secondary | ICD-10-CM | POA: Diagnosis not present

## 2012-06-19 DIAGNOSIS — L570 Actinic keratosis: Secondary | ICD-10-CM | POA: Diagnosis not present

## 2012-06-27 ENCOUNTER — Encounter: Payer: Self-pay | Admitting: Cardiovascular Disease

## 2012-07-01 DIAGNOSIS — H4011X0 Primary open-angle glaucoma, stage unspecified: Secondary | ICD-10-CM | POA: Diagnosis not present

## 2012-07-01 DIAGNOSIS — H409 Unspecified glaucoma: Secondary | ICD-10-CM | POA: Diagnosis not present

## 2012-07-15 DIAGNOSIS — I2510 Atherosclerotic heart disease of native coronary artery without angina pectoris: Secondary | ICD-10-CM | POA: Diagnosis not present

## 2012-07-15 DIAGNOSIS — I10 Essential (primary) hypertension: Secondary | ICD-10-CM | POA: Diagnosis not present

## 2012-07-15 DIAGNOSIS — Z950 Presence of cardiac pacemaker: Secondary | ICD-10-CM | POA: Diagnosis not present

## 2012-07-22 DIAGNOSIS — Z1231 Encounter for screening mammogram for malignant neoplasm of breast: Secondary | ICD-10-CM | POA: Diagnosis not present

## 2012-08-05 DIAGNOSIS — M353 Polymyalgia rheumatica: Secondary | ICD-10-CM | POA: Diagnosis not present

## 2012-08-05 DIAGNOSIS — M542 Cervicalgia: Secondary | ICD-10-CM | POA: Diagnosis not present

## 2012-08-05 DIAGNOSIS — M545 Low back pain: Secondary | ICD-10-CM | POA: Diagnosis not present

## 2012-09-12 DIAGNOSIS — M129 Arthropathy, unspecified: Secondary | ICD-10-CM | POA: Diagnosis not present

## 2012-09-12 DIAGNOSIS — I10 Essential (primary) hypertension: Secondary | ICD-10-CM | POA: Diagnosis not present

## 2012-09-12 DIAGNOSIS — K219 Gastro-esophageal reflux disease without esophagitis: Secondary | ICD-10-CM | POA: Diagnosis not present

## 2012-09-12 DIAGNOSIS — E7800 Pure hypercholesterolemia, unspecified: Secondary | ICD-10-CM | POA: Diagnosis not present

## 2012-09-12 DIAGNOSIS — E039 Hypothyroidism, unspecified: Secondary | ICD-10-CM | POA: Diagnosis not present

## 2012-09-12 DIAGNOSIS — F411 Generalized anxiety disorder: Secondary | ICD-10-CM | POA: Diagnosis not present

## 2012-09-19 DIAGNOSIS — I10 Essential (primary) hypertension: Secondary | ICD-10-CM | POA: Diagnosis not present

## 2012-09-19 DIAGNOSIS — E7800 Pure hypercholesterolemia, unspecified: Secondary | ICD-10-CM | POA: Diagnosis not present

## 2012-09-19 DIAGNOSIS — E039 Hypothyroidism, unspecified: Secondary | ICD-10-CM | POA: Diagnosis not present

## 2012-09-19 DIAGNOSIS — F329 Major depressive disorder, single episode, unspecified: Secondary | ICD-10-CM | POA: Diagnosis not present

## 2012-09-19 DIAGNOSIS — F411 Generalized anxiety disorder: Secondary | ICD-10-CM | POA: Diagnosis not present

## 2012-09-26 DIAGNOSIS — Z23 Encounter for immunization: Secondary | ICD-10-CM | POA: Diagnosis not present

## 2012-09-27 DIAGNOSIS — R300 Dysuria: Secondary | ICD-10-CM | POA: Diagnosis not present

## 2012-10-05 DIAGNOSIS — R300 Dysuria: Secondary | ICD-10-CM | POA: Diagnosis not present

## 2012-11-05 ENCOUNTER — Encounter: Payer: Self-pay | Admitting: Internal Medicine

## 2012-11-05 ENCOUNTER — Ambulatory Visit (INDEPENDENT_AMBULATORY_CARE_PROVIDER_SITE_OTHER): Payer: Medicare Other | Admitting: Internal Medicine

## 2012-11-05 VITALS — BP 134/66 | HR 89 | Temp 97.6°F | Ht 60.0 in | Wt 112.4 lb

## 2012-11-05 DIAGNOSIS — R140 Abdominal distension (gaseous): Secondary | ICD-10-CM | POA: Insufficient documentation

## 2012-11-05 DIAGNOSIS — R141 Gas pain: Secondary | ICD-10-CM

## 2012-11-05 DIAGNOSIS — K5900 Constipation, unspecified: Secondary | ICD-10-CM

## 2012-11-05 NOTE — Progress Notes (Signed)
Primary Care Physician:  Selinda Flavin, MD Primary Gastroenterologist:  Dr. Jena Gauss  Pre-Procedure History & Physical: HPI:  Deanna Villegas is a 79 y.o. female here for followup of GERD and abdominal bloating/constipation. Lifelong chronic constipation. A little better with a course of Bactrim for UTI recently. Hasn't had to take MiraLax. Normally takes MiraLax at times has to suppository in place water and rectum to facilitate a bowel movement. More abdominal bloating recently. No nausea or vomiting. No abdominal pain or fever. Occasional flare reflux symptoms in spite of protonix 40 mg daily. No dysphagia.  Pancolonic diverticulosis and small adenoma on prior colonoscopy. No Barrett's esophagus on prior EGD. No longer taking any oral bisphosphonate therapy  Past Medical History  Diagnosis Date  . GERD (gastroesophageal reflux disease)   . Glaucoma   . Hyperlipidemia   . Hypertension   . Irritable bowel   . Nutcracker esophagus   . Constipation   . Osteoporosis   . Anal stenosis   . CAD (coronary artery disease)   . Mitral valve prolapse   . Rheumatoid arthritis(714.0)   . Esophageal motility disorder   . Tubular adenoma     Past Surgical History  Procedure Laterality Date  . Tcs  2002    melanosis coli  . Pacemaker insertion    . Cardiac catheterization    . Gallbladder surgery    . Partial hysterectomy    . Breast cyst excision    . Colonoscopy  01/11/10    Dr. Jena Gauss- normal rectum, colonic diverticulosis, tubular adenoma  . Esophagogastroduodenoscopy  01/11/10    Dr. Jena Gauss-  normal esophagus, antral erosions bx= eroded and reactive gastric antral mucosa    Prior to Admission medications   Medication Sig Start Date End Date Taking? Authorizing Provider  aspirin 325 MG tablet Take 325 mg by mouth daily.   Yes Historical Provider, MD  brinzolamide (AZOPT) 1 % ophthalmic suspension Place 1 drop into both eyes 3 (three) times daily.   Yes Historical Provider, MD   calcium-vitamin D (OSCAL WITH D) 500-200 MG-UNIT per tablet Take 1 tablet by mouth daily with breakfast.   Yes Historical Provider, MD  COENZYME Q-10 PO Take 10 mg by mouth daily.   Yes Historical Provider, MD  diltiazem (CARDIZEM) 30 MG tablet Take 30 mg by mouth 2 (two) times daily.   Yes Historical Provider, MD  docusate sodium (COLACE) 100 MG capsule Take 100 mg by mouth 2 (two) times daily.   Yes Historical Provider, MD  fish oil-omega-3 fatty acids 1000 MG capsule Take 2 g by mouth daily.   Yes Historical Provider, MD  furosemide (LASIX) 20 MG tablet Take 20 mg by mouth daily.   Yes Historical Provider, MD  ipratropium (ATROVENT) 0.02 % nebulizer solution Take 500 mcg by nebulization 4 (four) times daily.   Yes Historical Provider, MD  isosorbide mononitrate (IMDUR) 60 MG 24 hr tablet Take 60 mg by mouth 2 (two) times daily.   Yes Historical Provider, MD  levothyroxine (SYNTHROID, LEVOTHROID) 75 MCG tablet Take 75 mcg by mouth daily before breakfast.   Yes Historical Provider, MD  lisinopril (PRINIVIL,ZESTRIL) 5 MG tablet Take 5 mg by mouth daily.   Yes Historical Provider, MD  metoprolol tartrate (LOPRESSOR) 25 MG tablet Take 12.5 mg by mouth 2 (two) times daily.   Yes Historical Provider, MD  nitroGLYCERIN (NITROSTAT) 0.4 MG SL tablet Place 0.4 mg under the tongue every 5 (five) minutes as needed for chest pain.   Yes Historical  Provider, MD  pantoprazole (PROTONIX) 40 MG tablet Take 40 mg by mouth daily.   Yes Historical Provider, MD  pilocarpine (PILOCAR) 2 % ophthalmic solution Place 1 drop into both eyes 4 (four) times daily.   Yes Historical Provider, MD  predniSONE (DELTASONE) 5 MG tablet Take 5 mg by mouth every other day.   Yes Historical Provider, MD  sertraline (ZOLOFT) 50 MG tablet Take 50 mg by mouth daily.   Yes Historical Provider, MD  simvastatin (ZOCOR) 10 MG tablet Take 10 mg by mouth at bedtime.   Yes Historical Provider, MD  temazepam (RESTORIL) 15 MG capsule Take 15 mg  by mouth at bedtime as needed for sleep.   Yes Historical Provider, MD  travoprost, benzalkonium, (TRAVATAN) 0.004 % ophthalmic solution Place 1 drop into both eyes at bedtime.   Yes Historical Provider, MD    Allergies as of 11/05/2012 - Review Complete 11/05/2012  Allergen Reaction Noted  . Amoxicillin    . Codeine    . Diphenhydramine hcl    . Meclizine hcl    . Morphine    . Penicillins    . Promethazine hcl    . Propoxyphene-acetaminophen    . Sulfonamide derivatives      History reviewed. No pertinent family history.  History   Social History  . Marital Status: Widowed    Spouse Name: N/A    Number of Children: N/A  . Years of Education: N/A   Occupational History  . Not on file.   Social History Main Topics  . Smoking status: Never Smoker   . Smokeless tobacco: Not on file  . Alcohol Use: No  . Drug Use: No  . Sexual Activity: Not on file   Other Topics Concern  . Not on file   Social History Narrative  . No narrative on file    Review of Systems: See HPI, otherwise negative ROS  Physical Exam: BP 134/66  Pulse 89  Temp(Src) 97.6 F (36.4 C) (Oral)  Ht 5' (1.524 m)  Wt 112 lb 6.4 oz (50.984 kg)  BMI 21.95 kg/m2 General:   Very pleasant somewhat frail somewhat hard of hearing elderly lady , pleasant and cooperative in NAD Skin:  Intact without significant lesions or rashes. Eyes:  Sclera clear, no icterus.   Conjunctiva pink. Ears:  Normal auditory acuity. Nose:  No deformity, discharge,  or lesions. Mouth:  No deformity or lesions. Neck:  Supple; no masses or thyromegaly. No significant cervical adenopathy. Lungs:  Clear throughout to auscultation.   No wheezes, crackles, or rhonchi. No acute distress. Heart:  Regular rate and rhythm; no murmurs, clicks, rubs,  or gallops. Abdomen: Non-distended, normal bowel sounds.  Soft and nontender without appreciable mass or hepatosplenomegaly.  Pulses:  Normal pulses noted. Extremities:  Without clubbing  or edema.  Impression:    Elderly lady with chronic constipation and intermittent bloating without any alarm symptoms. Occasional GERD breakthrough symptoms on concomitant proton pump inhibitor therapy. I suspect she may be inadequately managing constipation in this setting.  Recommendations:    We'll hold MiraLax and try a course of Linzess 145 mcg capsule 1 daily x2 weeks. Samples provided. Telephone report in 2 weeks. Continue anti-reflex measures. Continue continue Protonix 40 mg daily-I feel the benefits outweigh the risks.  Office followup appointment to be arranged.

## 2012-11-05 NOTE — Patient Instructions (Addendum)
Stop Miralax; begin linzess 145 - one capsule daily  Continue Protonix 40 mg daily  Call me in 2 weeks and let me know how your bowels are doing

## 2012-11-15 DIAGNOSIS — M4120 Other idiopathic scoliosis, site unspecified: Secondary | ICD-10-CM | POA: Diagnosis not present

## 2012-11-15 DIAGNOSIS — M5137 Other intervertebral disc degeneration, lumbosacral region: Secondary | ICD-10-CM | POA: Diagnosis not present

## 2012-11-18 DIAGNOSIS — I10 Essential (primary) hypertension: Secondary | ICD-10-CM | POA: Diagnosis not present

## 2012-11-18 DIAGNOSIS — M353 Polymyalgia rheumatica: Secondary | ICD-10-CM | POA: Diagnosis not present

## 2012-11-18 DIAGNOSIS — J449 Chronic obstructive pulmonary disease, unspecified: Secondary | ICD-10-CM | POA: Diagnosis not present

## 2012-11-20 ENCOUNTER — Telehealth: Payer: Self-pay

## 2012-11-20 MED ORDER — LINACLOTIDE 290 MCG PO CAPS: 290.0000 ug | ORAL_CAPSULE | Freq: Every day | ORAL | Status: DC

## 2012-11-20 NOTE — Telephone Encounter (Signed)
Pt is aware. rx sent to the pharmacy. 

## 2012-11-20 NOTE — Telephone Encounter (Signed)
I do not recommend suppositories on a regular basis. Let's call her in a prescription for Linzess 290 one daily. Let's prescribe 30 with 3 refills for now.Marland Kitchen

## 2012-11-20 NOTE — Telephone Encounter (Signed)
Pt called with PR- she loves the linzess and states its working well, having good bm's. She is still having to use suppositories and has to strain some. She only has one pill left and needs an rx.  She also wanted to know if she needed a stronger dosage. She accidentally took 2 one day and had a really good bm without using any suppositories.   She is requesting if RMR is not real busy this afternoon, that he call her.

## 2012-11-25 DIAGNOSIS — R262 Difficulty in walking, not elsewhere classified: Secondary | ICD-10-CM | POA: Diagnosis not present

## 2012-11-25 DIAGNOSIS — M4120 Other idiopathic scoliosis, site unspecified: Secondary | ICD-10-CM | POA: Diagnosis not present

## 2012-11-25 DIAGNOSIS — M545 Low back pain: Secondary | ICD-10-CM | POA: Diagnosis not present

## 2012-11-27 DIAGNOSIS — R262 Difficulty in walking, not elsewhere classified: Secondary | ICD-10-CM | POA: Diagnosis not present

## 2012-11-27 DIAGNOSIS — M4120 Other idiopathic scoliosis, site unspecified: Secondary | ICD-10-CM | POA: Diagnosis not present

## 2012-11-27 DIAGNOSIS — M545 Low back pain: Secondary | ICD-10-CM | POA: Diagnosis not present

## 2012-11-28 DIAGNOSIS — H409 Unspecified glaucoma: Secondary | ICD-10-CM | POA: Diagnosis not present

## 2012-11-28 DIAGNOSIS — H4011X0 Primary open-angle glaucoma, stage unspecified: Secondary | ICD-10-CM | POA: Diagnosis not present

## 2012-11-28 DIAGNOSIS — Z961 Presence of intraocular lens: Secondary | ICD-10-CM | POA: Diagnosis not present

## 2012-11-29 DIAGNOSIS — R262 Difficulty in walking, not elsewhere classified: Secondary | ICD-10-CM | POA: Diagnosis not present

## 2012-11-29 DIAGNOSIS — M4120 Other idiopathic scoliosis, site unspecified: Secondary | ICD-10-CM | POA: Diagnosis not present

## 2012-11-29 DIAGNOSIS — M545 Low back pain: Secondary | ICD-10-CM | POA: Diagnosis not present

## 2012-12-03 DIAGNOSIS — R262 Difficulty in walking, not elsewhere classified: Secondary | ICD-10-CM | POA: Diagnosis not present

## 2012-12-03 DIAGNOSIS — M4120 Other idiopathic scoliosis, site unspecified: Secondary | ICD-10-CM | POA: Diagnosis not present

## 2012-12-03 DIAGNOSIS — M545 Low back pain: Secondary | ICD-10-CM | POA: Diagnosis not present

## 2012-12-04 DIAGNOSIS — H16149 Punctate keratitis, unspecified eye: Secondary | ICD-10-CM | POA: Diagnosis not present

## 2012-12-05 DIAGNOSIS — M4120 Other idiopathic scoliosis, site unspecified: Secondary | ICD-10-CM | POA: Diagnosis not present

## 2012-12-05 DIAGNOSIS — M545 Low back pain: Secondary | ICD-10-CM | POA: Diagnosis not present

## 2012-12-05 DIAGNOSIS — R262 Difficulty in walking, not elsewhere classified: Secondary | ICD-10-CM | POA: Diagnosis not present

## 2012-12-10 ENCOUNTER — Telehealth: Payer: Self-pay | Admitting: Internal Medicine

## 2012-12-10 DIAGNOSIS — M545 Low back pain: Secondary | ICD-10-CM | POA: Diagnosis not present

## 2012-12-10 DIAGNOSIS — M4120 Other idiopathic scoliosis, site unspecified: Secondary | ICD-10-CM | POA: Diagnosis not present

## 2012-12-10 DIAGNOSIS — R262 Difficulty in walking, not elsewhere classified: Secondary | ICD-10-CM | POA: Diagnosis not present

## 2012-12-10 NOTE — Telephone Encounter (Signed)
Pt was checking on the papers that she faxed to help her get her medication. I told her that JL was aware and will be faxing papers today to her insurance company. Pt asked for samples.

## 2012-12-11 DIAGNOSIS — M545 Low back pain: Secondary | ICD-10-CM | POA: Diagnosis not present

## 2012-12-11 DIAGNOSIS — IMO0002 Reserved for concepts with insufficient information to code with codable children: Secondary | ICD-10-CM | POA: Diagnosis not present

## 2012-12-11 DIAGNOSIS — Z79899 Other long term (current) drug therapy: Secondary | ICD-10-CM | POA: Diagnosis not present

## 2012-12-11 DIAGNOSIS — M353 Polymyalgia rheumatica: Secondary | ICD-10-CM | POA: Diagnosis not present

## 2012-12-11 NOTE — Telephone Encounter (Signed)
Pt came by the office and I gave her two boxes of the 290 mcg

## 2012-12-12 DIAGNOSIS — M4120 Other idiopathic scoliosis, site unspecified: Secondary | ICD-10-CM | POA: Diagnosis not present

## 2012-12-12 DIAGNOSIS — R262 Difficulty in walking, not elsewhere classified: Secondary | ICD-10-CM | POA: Diagnosis not present

## 2012-12-12 DIAGNOSIS — M545 Low back pain: Secondary | ICD-10-CM | POA: Diagnosis not present

## 2012-12-13 DIAGNOSIS — R262 Difficulty in walking, not elsewhere classified: Secondary | ICD-10-CM | POA: Diagnosis not present

## 2012-12-13 DIAGNOSIS — M545 Low back pain: Secondary | ICD-10-CM | POA: Diagnosis not present

## 2012-12-13 DIAGNOSIS — M4120 Other idiopathic scoliosis, site unspecified: Secondary | ICD-10-CM | POA: Diagnosis not present

## 2012-12-16 DIAGNOSIS — M545 Low back pain: Secondary | ICD-10-CM | POA: Diagnosis not present

## 2012-12-16 DIAGNOSIS — M4120 Other idiopathic scoliosis, site unspecified: Secondary | ICD-10-CM | POA: Diagnosis not present

## 2012-12-16 DIAGNOSIS — R262 Difficulty in walking, not elsewhere classified: Secondary | ICD-10-CM | POA: Diagnosis not present

## 2012-12-18 DIAGNOSIS — Z85828 Personal history of other malignant neoplasm of skin: Secondary | ICD-10-CM | POA: Diagnosis not present

## 2012-12-18 DIAGNOSIS — M4120 Other idiopathic scoliosis, site unspecified: Secondary | ICD-10-CM | POA: Diagnosis not present

## 2012-12-18 DIAGNOSIS — L570 Actinic keratosis: Secondary | ICD-10-CM | POA: Diagnosis not present

## 2012-12-18 DIAGNOSIS — M545 Low back pain: Secondary | ICD-10-CM | POA: Diagnosis not present

## 2012-12-18 DIAGNOSIS — R262 Difficulty in walking, not elsewhere classified: Secondary | ICD-10-CM | POA: Diagnosis not present

## 2012-12-19 DIAGNOSIS — M4120 Other idiopathic scoliosis, site unspecified: Secondary | ICD-10-CM | POA: Diagnosis not present

## 2012-12-19 DIAGNOSIS — R262 Difficulty in walking, not elsewhere classified: Secondary | ICD-10-CM | POA: Diagnosis not present

## 2012-12-19 DIAGNOSIS — M545 Low back pain: Secondary | ICD-10-CM | POA: Diagnosis not present

## 2012-12-23 DIAGNOSIS — M4120 Other idiopathic scoliosis, site unspecified: Secondary | ICD-10-CM | POA: Diagnosis not present

## 2012-12-23 DIAGNOSIS — M545 Low back pain: Secondary | ICD-10-CM | POA: Diagnosis not present

## 2012-12-23 DIAGNOSIS — Z961 Presence of intraocular lens: Secondary | ICD-10-CM | POA: Diagnosis not present

## 2012-12-23 DIAGNOSIS — H4011X0 Primary open-angle glaucoma, stage unspecified: Secondary | ICD-10-CM | POA: Diagnosis not present

## 2012-12-23 DIAGNOSIS — H409 Unspecified glaucoma: Secondary | ICD-10-CM | POA: Diagnosis not present

## 2012-12-23 DIAGNOSIS — R262 Difficulty in walking, not elsewhere classified: Secondary | ICD-10-CM | POA: Diagnosis not present

## 2012-12-25 DIAGNOSIS — R262 Difficulty in walking, not elsewhere classified: Secondary | ICD-10-CM | POA: Diagnosis not present

## 2012-12-25 DIAGNOSIS — M4120 Other idiopathic scoliosis, site unspecified: Secondary | ICD-10-CM | POA: Diagnosis not present

## 2012-12-25 DIAGNOSIS — M545 Low back pain: Secondary | ICD-10-CM | POA: Diagnosis not present

## 2012-12-30 ENCOUNTER — Telehealth: Payer: Self-pay | Admitting: Internal Medicine

## 2012-12-30 DIAGNOSIS — M4120 Other idiopathic scoliosis, site unspecified: Secondary | ICD-10-CM | POA: Diagnosis not present

## 2012-12-30 DIAGNOSIS — R262 Difficulty in walking, not elsewhere classified: Secondary | ICD-10-CM | POA: Diagnosis not present

## 2012-12-30 DIAGNOSIS — M545 Low back pain: Secondary | ICD-10-CM | POA: Diagnosis not present

## 2012-12-30 NOTE — Telephone Encounter (Signed)
Patient assistance was approved and medication arrived today. She is aware and will pick them up in the morning.

## 2012-12-30 NOTE — Telephone Encounter (Signed)
Pt called to see if her Linzess had been mailed here for her to pick up. I didn't see anything up front in the sample drawer. Do you know anything about this? I told her that you were at lunch and would call her back. [phone]

## 2012-12-31 DIAGNOSIS — F411 Generalized anxiety disorder: Secondary | ICD-10-CM | POA: Diagnosis not present

## 2012-12-31 DIAGNOSIS — I10 Essential (primary) hypertension: Secondary | ICD-10-CM | POA: Diagnosis not present

## 2012-12-31 DIAGNOSIS — J449 Chronic obstructive pulmonary disease, unspecified: Secondary | ICD-10-CM | POA: Diagnosis not present

## 2013-01-02 DIAGNOSIS — R262 Difficulty in walking, not elsewhere classified: Secondary | ICD-10-CM | POA: Diagnosis not present

## 2013-01-02 DIAGNOSIS — M4120 Other idiopathic scoliosis, site unspecified: Secondary | ICD-10-CM | POA: Diagnosis not present

## 2013-01-02 DIAGNOSIS — M545 Low back pain: Secondary | ICD-10-CM | POA: Diagnosis not present

## 2013-01-06 DIAGNOSIS — M545 Low back pain: Secondary | ICD-10-CM | POA: Diagnosis not present

## 2013-01-06 DIAGNOSIS — M4120 Other idiopathic scoliosis, site unspecified: Secondary | ICD-10-CM | POA: Diagnosis not present

## 2013-01-06 DIAGNOSIS — R262 Difficulty in walking, not elsewhere classified: Secondary | ICD-10-CM | POA: Diagnosis not present

## 2013-01-09 DIAGNOSIS — M545 Low back pain: Secondary | ICD-10-CM | POA: Diagnosis not present

## 2013-01-09 DIAGNOSIS — R262 Difficulty in walking, not elsewhere classified: Secondary | ICD-10-CM | POA: Diagnosis not present

## 2013-01-09 DIAGNOSIS — M4120 Other idiopathic scoliosis, site unspecified: Secondary | ICD-10-CM | POA: Diagnosis not present

## 2013-01-10 DIAGNOSIS — H02059 Trichiasis without entropian unspecified eye, unspecified eyelid: Secondary | ICD-10-CM | POA: Diagnosis not present

## 2013-01-14 DIAGNOSIS — Z9861 Coronary angioplasty status: Secondary | ICD-10-CM | POA: Diagnosis not present

## 2013-01-14 DIAGNOSIS — I2510 Atherosclerotic heart disease of native coronary artery without angina pectoris: Secondary | ICD-10-CM | POA: Diagnosis not present

## 2013-01-14 DIAGNOSIS — Z950 Presence of cardiac pacemaker: Secondary | ICD-10-CM | POA: Diagnosis not present

## 2013-01-15 DIAGNOSIS — H4011X0 Primary open-angle glaucoma, stage unspecified: Secondary | ICD-10-CM | POA: Diagnosis not present

## 2013-01-16 DIAGNOSIS — Z23 Encounter for immunization: Secondary | ICD-10-CM | POA: Diagnosis not present

## 2013-02-04 DIAGNOSIS — N3090 Cystitis, unspecified without hematuria: Secondary | ICD-10-CM | POA: Diagnosis not present

## 2013-02-08 ENCOUNTER — Telehealth: Payer: Self-pay | Admitting: Internal Medicine

## 2013-02-08 NOTE — Telephone Encounter (Signed)
Patient called having some low-volume rectal bleeding. Anal stenosis.  Often instilled water in the rectum to have a bowel movement. Strains chronically but 2-3 good BM's daily. Takes oat bran nightly as a fiber supplement. Abdominal pain and bloating totally results and starting Linzess couple of months ago.  Spends 10 or more minutes on the toilet at a time attempting to have a bowel movement the She's not dizzy/lightheaded; small amount or rectal bleeding.  I recommended Colace 100 mg daily to twice daily to her regimen. Avoid manipulating anus and rectum any way other than wiping. Continue Linzess;  Limit toilet time to 3-5 minutes at a time. If bleeding persists, go to  ER. Otherwise, call us next week and let us to have a she is doing.

## 2013-02-11 DIAGNOSIS — H01029 Squamous blepharitis unspecified eye, unspecified eyelid: Secondary | ICD-10-CM | POA: Diagnosis not present

## 2013-02-13 NOTE — Telephone Encounter (Signed)
Progress noted. Let's try the novel approach of Linzess 145 alternating with Linzess 290 every other day. She can double up on 145's and/or come by and get some of the 290 samples;  let's do this for about 2-3 weeks and then get a progress report. No other change in her regimen for now

## 2013-02-13 NOTE — Telephone Encounter (Signed)
Pt called today with a progress report- she stated she is still seeing a small amount of blood when she wipes. She has stopped using the water and suppositories and is taking the colace bid but still has a hard time getting the stool to start, she is straining to get it started, but She said the stool was softer now. She is limiting the time she sits on the toilet to about 3 minutes now. No c/o abd pain, no blood in the stool.

## 2013-02-17 DIAGNOSIS — N3090 Cystitis, unspecified without hematuria: Secondary | ICD-10-CM | POA: Diagnosis not present

## 2013-02-17 DIAGNOSIS — R300 Dysuria: Secondary | ICD-10-CM | POA: Diagnosis not present

## 2013-02-17 NOTE — Telephone Encounter (Signed)
Pt is aware.  

## 2013-02-21 DIAGNOSIS — Z87440 Personal history of urinary (tract) infections: Secondary | ICD-10-CM | POA: Diagnosis not present

## 2013-02-21 DIAGNOSIS — N905 Atrophy of vulva: Secondary | ICD-10-CM | POA: Diagnosis not present

## 2013-02-21 DIAGNOSIS — R3915 Urgency of urination: Secondary | ICD-10-CM | POA: Diagnosis not present

## 2013-02-21 DIAGNOSIS — K5900 Constipation, unspecified: Secondary | ICD-10-CM | POA: Diagnosis not present

## 2013-03-02 DIAGNOSIS — Z882 Allergy status to sulfonamides status: Secondary | ICD-10-CM | POA: Diagnosis not present

## 2013-03-02 DIAGNOSIS — I252 Old myocardial infarction: Secondary | ICD-10-CM | POA: Diagnosis not present

## 2013-03-02 DIAGNOSIS — K589 Irritable bowel syndrome without diarrhea: Secondary | ICD-10-CM | POA: Diagnosis not present

## 2013-03-02 DIAGNOSIS — I10 Essential (primary) hypertension: Secondary | ICD-10-CM | POA: Diagnosis not present

## 2013-03-02 DIAGNOSIS — J449 Chronic obstructive pulmonary disease, unspecified: Secondary | ICD-10-CM | POA: Diagnosis not present

## 2013-03-02 DIAGNOSIS — Z79899 Other long term (current) drug therapy: Secondary | ICD-10-CM | POA: Diagnosis not present

## 2013-03-02 DIAGNOSIS — I442 Atrioventricular block, complete: Secondary | ICD-10-CM | POA: Diagnosis not present

## 2013-03-02 DIAGNOSIS — K224 Dyskinesia of esophagus: Secondary | ICD-10-CM | POA: Diagnosis not present

## 2013-03-02 DIAGNOSIS — R1013 Epigastric pain: Secondary | ICD-10-CM | POA: Diagnosis not present

## 2013-03-02 DIAGNOSIS — Z885 Allergy status to narcotic agent status: Secondary | ICD-10-CM | POA: Diagnosis not present

## 2013-03-02 DIAGNOSIS — R6889 Other general symptoms and signs: Secondary | ICD-10-CM | POA: Diagnosis not present

## 2013-03-02 DIAGNOSIS — R079 Chest pain, unspecified: Secondary | ICD-10-CM | POA: Diagnosis not present

## 2013-03-02 DIAGNOSIS — Z888 Allergy status to other drugs, medicaments and biological substances status: Secondary | ICD-10-CM | POA: Diagnosis not present

## 2013-03-02 DIAGNOSIS — J9819 Other pulmonary collapse: Secondary | ICD-10-CM | POA: Diagnosis not present

## 2013-03-02 DIAGNOSIS — H409 Unspecified glaucoma: Secondary | ICD-10-CM | POA: Diagnosis not present

## 2013-03-02 DIAGNOSIS — Z7982 Long term (current) use of aspirin: Secondary | ICD-10-CM | POA: Diagnosis not present

## 2013-03-02 DIAGNOSIS — I2510 Atherosclerotic heart disease of native coronary artery without angina pectoris: Secondary | ICD-10-CM | POA: Diagnosis not present

## 2013-03-02 DIAGNOSIS — Z8249 Family history of ischemic heart disease and other diseases of the circulatory system: Secondary | ICD-10-CM | POA: Diagnosis not present

## 2013-03-02 DIAGNOSIS — Z9861 Coronary angioplasty status: Secondary | ICD-10-CM | POA: Diagnosis not present

## 2013-03-02 DIAGNOSIS — R0789 Other chest pain: Secondary | ICD-10-CM | POA: Diagnosis not present

## 2013-03-02 DIAGNOSIS — M129 Arthropathy, unspecified: Secondary | ICD-10-CM | POA: Diagnosis not present

## 2013-03-02 DIAGNOSIS — Z880 Allergy status to penicillin: Secondary | ICD-10-CM | POA: Diagnosis not present

## 2013-03-02 DIAGNOSIS — Z950 Presence of cardiac pacemaker: Secondary | ICD-10-CM | POA: Diagnosis not present

## 2013-03-03 DIAGNOSIS — R079 Chest pain, unspecified: Secondary | ICD-10-CM | POA: Diagnosis not present

## 2013-03-07 DIAGNOSIS — Z87440 Personal history of urinary (tract) infections: Secondary | ICD-10-CM | POA: Diagnosis not present

## 2013-03-07 DIAGNOSIS — K5900 Constipation, unspecified: Secondary | ICD-10-CM | POA: Diagnosis not present

## 2013-03-07 DIAGNOSIS — R3915 Urgency of urination: Secondary | ICD-10-CM | POA: Diagnosis not present

## 2013-03-07 DIAGNOSIS — N318 Other neuromuscular dysfunction of bladder: Secondary | ICD-10-CM | POA: Diagnosis not present

## 2013-03-10 ENCOUNTER — Telehealth: Payer: Self-pay

## 2013-03-10 DIAGNOSIS — H04129 Dry eye syndrome of unspecified lacrimal gland: Secondary | ICD-10-CM | POA: Diagnosis not present

## 2013-03-10 MED ORDER — LINACLOTIDE 290 MCG PO CAPS: 290.0000 ug | ORAL_CAPSULE | Freq: Every day | ORAL | Status: DC

## 2013-03-10 NOTE — Telephone Encounter (Signed)
Pt needs a refill on her linzess. She needs a written rx to be sent to the patient assistance.

## 2013-03-10 NOTE — Telephone Encounter (Signed)
Faxed to Patient assistance.  

## 2013-03-10 NOTE — Telephone Encounter (Signed)
Completed.

## 2013-03-12 DIAGNOSIS — I442 Atrioventricular block, complete: Secondary | ICD-10-CM | POA: Diagnosis not present

## 2013-03-12 DIAGNOSIS — I2510 Atherosclerotic heart disease of native coronary artery without angina pectoris: Secondary | ICD-10-CM | POA: Diagnosis not present

## 2013-03-13 DIAGNOSIS — H01009 Unspecified blepharitis unspecified eye, unspecified eyelid: Secondary | ICD-10-CM | POA: Diagnosis not present

## 2013-03-17 DIAGNOSIS — Z961 Presence of intraocular lens: Secondary | ICD-10-CM | POA: Diagnosis not present

## 2013-03-17 DIAGNOSIS — H409 Unspecified glaucoma: Secondary | ICD-10-CM | POA: Diagnosis not present

## 2013-03-17 DIAGNOSIS — L2089 Other atopic dermatitis: Secondary | ICD-10-CM | POA: Diagnosis not present

## 2013-03-17 DIAGNOSIS — H4011X0 Primary open-angle glaucoma, stage unspecified: Secondary | ICD-10-CM | POA: Diagnosis not present

## 2013-04-01 DIAGNOSIS — I209 Angina pectoris, unspecified: Secondary | ICD-10-CM | POA: Diagnosis not present

## 2013-04-01 DIAGNOSIS — K219 Gastro-esophageal reflux disease without esophagitis: Secondary | ICD-10-CM | POA: Diagnosis not present

## 2013-04-04 DIAGNOSIS — Z87440 Personal history of urinary (tract) infections: Secondary | ICD-10-CM | POA: Diagnosis not present

## 2013-04-04 DIAGNOSIS — K5900 Constipation, unspecified: Secondary | ICD-10-CM | POA: Diagnosis not present

## 2013-04-04 DIAGNOSIS — N318 Other neuromuscular dysfunction of bladder: Secondary | ICD-10-CM | POA: Diagnosis not present

## 2013-04-04 DIAGNOSIS — R3915 Urgency of urination: Secondary | ICD-10-CM | POA: Diagnosis not present

## 2013-04-07 NOTE — Telephone Encounter (Signed)
Pt called today stating that she needs more samples. She said that she would be in town next Monday and she would come by to get them then. I told her to call back Friday to make sure we had them ready for her.

## 2013-04-08 ENCOUNTER — Telehealth: Payer: Self-pay | Admitting: *Deleted

## 2013-04-08 NOTE — Telephone Encounter (Signed)
Pt called wanting to check on the linzess. Please advise [phone]

## 2013-04-09 DIAGNOSIS — M545 Low back pain, unspecified: Secondary | ICD-10-CM | POA: Diagnosis not present

## 2013-04-09 DIAGNOSIS — M76899 Other specified enthesopathies of unspecified lower limb, excluding foot: Secondary | ICD-10-CM | POA: Diagnosis not present

## 2013-04-09 DIAGNOSIS — Z79899 Other long term (current) drug therapy: Secondary | ICD-10-CM | POA: Diagnosis not present

## 2013-04-09 DIAGNOSIS — M353 Polymyalgia rheumatica: Secondary | ICD-10-CM | POA: Diagnosis not present

## 2013-04-09 NOTE — Telephone Encounter (Signed)
Refill was sent to the company in Feb. pts daughter came to the office and requested samples for the pt. Gave #4 boxes of Linzess. Will call patient assistance and see if there is a problem.

## 2013-04-10 NOTE — Telephone Encounter (Signed)
Called pt assistance- pt needs a new application filled out. Informed pt that I would mail it to her and she can mail it back to me.  Application in the mail to the pt.

## 2013-04-14 DIAGNOSIS — K5900 Constipation, unspecified: Secondary | ICD-10-CM | POA: Diagnosis not present

## 2013-04-14 DIAGNOSIS — I442 Atrioventricular block, complete: Secondary | ICD-10-CM | POA: Diagnosis not present

## 2013-04-14 DIAGNOSIS — H02059 Trichiasis without entropian unspecified eye, unspecified eyelid: Secondary | ICD-10-CM | POA: Diagnosis not present

## 2013-04-14 DIAGNOSIS — N905 Atrophy of vulva: Secondary | ICD-10-CM | POA: Diagnosis not present

## 2013-04-14 DIAGNOSIS — H4011X0 Primary open-angle glaucoma, stage unspecified: Secondary | ICD-10-CM | POA: Diagnosis not present

## 2013-04-14 DIAGNOSIS — Z87440 Personal history of urinary (tract) infections: Secondary | ICD-10-CM | POA: Diagnosis not present

## 2013-04-22 ENCOUNTER — Telehealth: Payer: Self-pay | Admitting: Internal Medicine

## 2013-04-22 ENCOUNTER — Other Ambulatory Visit: Payer: Self-pay | Admitting: Gastroenterology

## 2013-04-22 MED ORDER — LINACLOTIDE 290 MCG PO CAPS: 290.0000 ug | ORAL_CAPSULE | Freq: Every day | ORAL | Status: DC

## 2013-04-22 NOTE — Telephone Encounter (Signed)
Pt assistance papers have been faxed to the company.

## 2013-04-22 NOTE — Telephone Encounter (Signed)
Pt called checking the status of her patient assistance papers regarding her prescription and then asked if she could get some samples of Linzess. She said that she has to take 2 of the 90s for them to do her any good. Please advise.

## 2013-05-05 ENCOUNTER — Telehealth: Payer: Self-pay | Admitting: *Deleted

## 2013-05-05 DIAGNOSIS — H4011X0 Primary open-angle glaucoma, stage unspecified: Secondary | ICD-10-CM | POA: Diagnosis not present

## 2013-05-05 DIAGNOSIS — H409 Unspecified glaucoma: Secondary | ICD-10-CM | POA: Diagnosis not present

## 2013-05-05 NOTE — Telephone Encounter (Signed)
#  4 boxes given. Pt assistance forms have been faxed to the company.

## 2013-05-05 NOTE — Telephone Encounter (Signed)
Pt called stating she would like to get some samples she is wanting to come by here this morning she has a doctor's appointment at 8:30 pt says she can't pay 45$ for 30 pills. Please advise

## 2013-05-12 ENCOUNTER — Telehealth: Payer: Self-pay | Admitting: Internal Medicine

## 2013-05-12 NOTE — Telephone Encounter (Signed)
Forms have been faxed multiple times. refaxed again today.tried to call Linzess pt assistance, could not get thru to a rep.

## 2013-05-12 NOTE — Telephone Encounter (Signed)
Patient called again today. She said that she had called the [employer] and they do not have the order and would we please fax it to [phone] ATTN: [place].

## 2013-05-14 NOTE — Telephone Encounter (Signed)
Pt called and said she has spoken with the pt assistance co and they need more information from her and she will send it to them.

## 2013-05-20 ENCOUNTER — Telehealth: Payer: Self-pay | Admitting: *Deleted

## 2013-05-20 NOTE — Telephone Encounter (Signed)
Pt called stating she needs 4 bottles of linzess sent into her pharmacy and said it needs to be stated that she needs 4 bottles. Pt uses [facility] she gave me there phone number for physicians [phone] customer service number is [phone] and their fax number is [phone]. Pt said her ID number is [id number]. Pt said her linzess needs to be 290 mg and she takes them 2x a day. Please advise

## 2013-05-21 ENCOUNTER — Telehealth: Payer: Self-pay | Admitting: *Deleted

## 2013-05-21 MED ORDER — LINACLOTIDE 290 MCG PO CAPS: 290.0000 ug | ORAL_CAPSULE | Freq: Every day | ORAL | Status: DC

## 2013-05-21 MED ORDER — LINACLOTIDE 290 MCG PO CAPS: 290.0000 ug | ORAL_CAPSULE | Freq: Two times a day (BID) | ORAL | Status: DC

## 2013-05-21 NOTE — Telephone Encounter (Signed)
Refill has been printed and faxed to the company.

## 2013-05-21 NOTE — Telephone Encounter (Signed)
Pt called about her medication, made pt aware Almyra Free is not here right now that she will be in later today and Almyra Free will call her. Pt understood. Please advise

## 2013-05-21 NOTE — Telephone Encounter (Signed)
Routing to refill box  

## 2013-05-21 NOTE — Addendum Note (Signed)
Addended by: Orvil Feil on: 05/21/2013 01:50 PM   Modules accepted: Orders

## 2013-05-21 NOTE — Telephone Encounter (Signed)
Printed prescription  

## 2013-05-21 NOTE — Telephone Encounter (Signed)
rx has been faxed 

## 2013-05-27 DIAGNOSIS — B370 Candidal stomatitis: Secondary | ICD-10-CM | POA: Diagnosis not present

## 2013-05-27 DIAGNOSIS — R609 Edema, unspecified: Secondary | ICD-10-CM | POA: Diagnosis not present

## 2013-05-27 DIAGNOSIS — E039 Hypothyroidism, unspecified: Secondary | ICD-10-CM | POA: Diagnosis not present

## 2013-06-02 DIAGNOSIS — M79609 Pain in unspecified limb: Secondary | ICD-10-CM | POA: Diagnosis not present

## 2013-06-02 DIAGNOSIS — H4011X0 Primary open-angle glaucoma, stage unspecified: Secondary | ICD-10-CM | POA: Diagnosis not present

## 2013-06-02 DIAGNOSIS — H409 Unspecified glaucoma: Secondary | ICD-10-CM | POA: Diagnosis not present

## 2013-06-02 DIAGNOSIS — G5750 Tarsal tunnel syndrome, unspecified lower limb: Secondary | ICD-10-CM | POA: Diagnosis not present

## 2013-06-11 DIAGNOSIS — L570 Actinic keratosis: Secondary | ICD-10-CM | POA: Diagnosis not present

## 2013-06-12 DIAGNOSIS — M545 Low back pain, unspecified: Secondary | ICD-10-CM | POA: Diagnosis not present

## 2013-06-12 DIAGNOSIS — R262 Difficulty in walking, not elsewhere classified: Secondary | ICD-10-CM | POA: Diagnosis not present

## 2013-06-12 DIAGNOSIS — M6281 Muscle weakness (generalized): Secondary | ICD-10-CM | POA: Diagnosis not present

## 2013-06-16 DIAGNOSIS — M545 Low back pain, unspecified: Secondary | ICD-10-CM | POA: Diagnosis not present

## 2013-06-16 DIAGNOSIS — R262 Difficulty in walking, not elsewhere classified: Secondary | ICD-10-CM | POA: Diagnosis not present

## 2013-06-16 DIAGNOSIS — M6281 Muscle weakness (generalized): Secondary | ICD-10-CM | POA: Diagnosis not present

## 2013-06-18 DIAGNOSIS — M545 Low back pain, unspecified: Secondary | ICD-10-CM | POA: Diagnosis not present

## 2013-06-18 DIAGNOSIS — M6281 Muscle weakness (generalized): Secondary | ICD-10-CM | POA: Diagnosis not present

## 2013-06-18 DIAGNOSIS — R262 Difficulty in walking, not elsewhere classified: Secondary | ICD-10-CM | POA: Diagnosis not present

## 2013-06-19 DIAGNOSIS — I10 Essential (primary) hypertension: Secondary | ICD-10-CM | POA: Diagnosis not present

## 2013-06-19 DIAGNOSIS — R609 Edema, unspecified: Secondary | ICD-10-CM | POA: Diagnosis not present

## 2013-06-19 DIAGNOSIS — E7800 Pure hypercholesterolemia, unspecified: Secondary | ICD-10-CM | POA: Diagnosis not present

## 2013-06-20 DIAGNOSIS — M6281 Muscle weakness (generalized): Secondary | ICD-10-CM | POA: Diagnosis not present

## 2013-06-20 DIAGNOSIS — M545 Low back pain, unspecified: Secondary | ICD-10-CM | POA: Diagnosis not present

## 2013-06-20 DIAGNOSIS — R262 Difficulty in walking, not elsewhere classified: Secondary | ICD-10-CM | POA: Diagnosis not present

## 2013-06-20 DIAGNOSIS — R609 Edema, unspecified: Secondary | ICD-10-CM | POA: Diagnosis not present

## 2013-06-25 DIAGNOSIS — M545 Low back pain, unspecified: Secondary | ICD-10-CM | POA: Diagnosis not present

## 2013-06-25 DIAGNOSIS — R262 Difficulty in walking, not elsewhere classified: Secondary | ICD-10-CM | POA: Diagnosis not present

## 2013-06-25 DIAGNOSIS — M6281 Muscle weakness (generalized): Secondary | ICD-10-CM | POA: Diagnosis not present

## 2013-06-26 DIAGNOSIS — R262 Difficulty in walking, not elsewhere classified: Secondary | ICD-10-CM | POA: Diagnosis not present

## 2013-06-26 DIAGNOSIS — M545 Low back pain, unspecified: Secondary | ICD-10-CM | POA: Diagnosis not present

## 2013-06-26 DIAGNOSIS — M6281 Muscle weakness (generalized): Secondary | ICD-10-CM | POA: Diagnosis not present

## 2013-06-27 DIAGNOSIS — M6281 Muscle weakness (generalized): Secondary | ICD-10-CM | POA: Diagnosis not present

## 2013-06-27 DIAGNOSIS — R262 Difficulty in walking, not elsewhere classified: Secondary | ICD-10-CM | POA: Diagnosis not present

## 2013-06-27 DIAGNOSIS — M545 Low back pain, unspecified: Secondary | ICD-10-CM | POA: Diagnosis not present

## 2013-06-30 DIAGNOSIS — R262 Difficulty in walking, not elsewhere classified: Secondary | ICD-10-CM | POA: Diagnosis not present

## 2013-06-30 DIAGNOSIS — M545 Low back pain, unspecified: Secondary | ICD-10-CM | POA: Diagnosis not present

## 2013-06-30 DIAGNOSIS — M6281 Muscle weakness (generalized): Secondary | ICD-10-CM | POA: Diagnosis not present

## 2013-07-01 DIAGNOSIS — E039 Hypothyroidism, unspecified: Secondary | ICD-10-CM | POA: Diagnosis not present

## 2013-07-01 DIAGNOSIS — J449 Chronic obstructive pulmonary disease, unspecified: Secondary | ICD-10-CM | POA: Diagnosis not present

## 2013-07-01 DIAGNOSIS — I10 Essential (primary) hypertension: Secondary | ICD-10-CM | POA: Diagnosis not present

## 2013-07-01 DIAGNOSIS — E785 Hyperlipidemia, unspecified: Secondary | ICD-10-CM | POA: Diagnosis not present

## 2013-07-02 DIAGNOSIS — M6281 Muscle weakness (generalized): Secondary | ICD-10-CM | POA: Diagnosis not present

## 2013-07-02 DIAGNOSIS — M545 Low back pain, unspecified: Secondary | ICD-10-CM | POA: Diagnosis not present

## 2013-07-02 DIAGNOSIS — R262 Difficulty in walking, not elsewhere classified: Secondary | ICD-10-CM | POA: Diagnosis not present

## 2013-07-04 DIAGNOSIS — M545 Low back pain, unspecified: Secondary | ICD-10-CM | POA: Diagnosis not present

## 2013-07-04 DIAGNOSIS — M6281 Muscle weakness (generalized): Secondary | ICD-10-CM | POA: Diagnosis not present

## 2013-07-04 DIAGNOSIS — R262 Difficulty in walking, not elsewhere classified: Secondary | ICD-10-CM | POA: Diagnosis not present

## 2013-07-05 DIAGNOSIS — S93409A Sprain of unspecified ligament of unspecified ankle, initial encounter: Secondary | ICD-10-CM | POA: Diagnosis not present

## 2013-07-05 DIAGNOSIS — I10 Essential (primary) hypertension: Secondary | ICD-10-CM | POA: Diagnosis not present

## 2013-07-05 DIAGNOSIS — E7800 Pure hypercholesterolemia, unspecified: Secondary | ICD-10-CM | POA: Diagnosis not present

## 2013-07-05 DIAGNOSIS — R609 Edema, unspecified: Secondary | ICD-10-CM | POA: Diagnosis not present

## 2013-07-05 DIAGNOSIS — I872 Venous insufficiency (chronic) (peripheral): Secondary | ICD-10-CM | POA: Diagnosis not present

## 2013-07-07 DIAGNOSIS — R262 Difficulty in walking, not elsewhere classified: Secondary | ICD-10-CM | POA: Diagnosis not present

## 2013-07-07 DIAGNOSIS — M6281 Muscle weakness (generalized): Secondary | ICD-10-CM | POA: Diagnosis not present

## 2013-07-07 DIAGNOSIS — M545 Low back pain, unspecified: Secondary | ICD-10-CM | POA: Diagnosis not present

## 2013-07-08 DIAGNOSIS — R609 Edema, unspecified: Secondary | ICD-10-CM | POA: Diagnosis not present

## 2013-07-08 DIAGNOSIS — S93409A Sprain of unspecified ligament of unspecified ankle, initial encounter: Secondary | ICD-10-CM | POA: Diagnosis not present

## 2013-07-09 DIAGNOSIS — M6281 Muscle weakness (generalized): Secondary | ICD-10-CM | POA: Diagnosis not present

## 2013-07-09 DIAGNOSIS — M25579 Pain in unspecified ankle and joints of unspecified foot: Secondary | ICD-10-CM | POA: Diagnosis not present

## 2013-07-09 DIAGNOSIS — M545 Low back pain, unspecified: Secondary | ICD-10-CM | POA: Diagnosis not present

## 2013-07-09 DIAGNOSIS — R262 Difficulty in walking, not elsewhere classified: Secondary | ICD-10-CM | POA: Diagnosis not present

## 2013-07-11 DIAGNOSIS — M6281 Muscle weakness (generalized): Secondary | ICD-10-CM | POA: Diagnosis not present

## 2013-07-11 DIAGNOSIS — M545 Low back pain, unspecified: Secondary | ICD-10-CM | POA: Diagnosis not present

## 2013-07-11 DIAGNOSIS — M25579 Pain in unspecified ankle and joints of unspecified foot: Secondary | ICD-10-CM | POA: Diagnosis not present

## 2013-07-11 DIAGNOSIS — R262 Difficulty in walking, not elsewhere classified: Secondary | ICD-10-CM | POA: Diagnosis not present

## 2013-07-14 ENCOUNTER — Telehealth: Payer: Self-pay | Admitting: Internal Medicine

## 2013-07-14 DIAGNOSIS — H4011X0 Primary open-angle glaucoma, stage unspecified: Secondary | ICD-10-CM | POA: Diagnosis not present

## 2013-07-14 DIAGNOSIS — H409 Unspecified glaucoma: Secondary | ICD-10-CM | POA: Diagnosis not present

## 2013-07-14 NOTE — Telephone Encounter (Signed)
Please call [employer] to back date last paper to May 1st for Linzess Tier Exception call [phone] then she will be able to get without paying anything

## 2013-07-15 NOTE — Telephone Encounter (Signed)
I called [facility], spoke with [facility],  She is going to fax me the paperwork to fill out.

## 2013-07-16 DIAGNOSIS — R262 Difficulty in walking, not elsewhere classified: Secondary | ICD-10-CM | POA: Diagnosis not present

## 2013-07-16 DIAGNOSIS — M25579 Pain in unspecified ankle and joints of unspecified foot: Secondary | ICD-10-CM | POA: Diagnosis not present

## 2013-07-16 DIAGNOSIS — M545 Low back pain, unspecified: Secondary | ICD-10-CM | POA: Diagnosis not present

## 2013-07-16 DIAGNOSIS — M6281 Muscle weakness (generalized): Secondary | ICD-10-CM | POA: Diagnosis not present

## 2013-07-16 NOTE — Telephone Encounter (Signed)
Deanna Villegas called to make sure the form that [employer] needed was faxed back to them.  Routing to [place] for confirmation this was actually done.

## 2013-07-16 NOTE — Telephone Encounter (Signed)
Yes, this information has been filled out and faxed to [facility].

## 2013-07-17 DIAGNOSIS — M6281 Muscle weakness (generalized): Secondary | ICD-10-CM | POA: Diagnosis not present

## 2013-07-17 DIAGNOSIS — R262 Difficulty in walking, not elsewhere classified: Secondary | ICD-10-CM | POA: Diagnosis not present

## 2013-07-17 DIAGNOSIS — M25579 Pain in unspecified ankle and joints of unspecified foot: Secondary | ICD-10-CM | POA: Diagnosis not present

## 2013-07-17 DIAGNOSIS — M545 Low back pain, unspecified: Secondary | ICD-10-CM | POA: Diagnosis not present

## 2013-07-18 DIAGNOSIS — M545 Low back pain, unspecified: Secondary | ICD-10-CM | POA: Diagnosis not present

## 2013-07-18 DIAGNOSIS — M6281 Muscle weakness (generalized): Secondary | ICD-10-CM | POA: Diagnosis not present

## 2013-07-18 DIAGNOSIS — R262 Difficulty in walking, not elsewhere classified: Secondary | ICD-10-CM | POA: Diagnosis not present

## 2013-07-18 DIAGNOSIS — M25579 Pain in unspecified ankle and joints of unspecified foot: Secondary | ICD-10-CM | POA: Diagnosis not present

## 2013-07-21 ENCOUNTER — Other Ambulatory Visit: Payer: Self-pay | Admitting: *Deleted

## 2013-07-21 DIAGNOSIS — W06XXXA Fall from bed, initial encounter: Secondary | ICD-10-CM | POA: Diagnosis not present

## 2013-07-21 DIAGNOSIS — Z950 Presence of cardiac pacemaker: Secondary | ICD-10-CM | POA: Diagnosis not present

## 2013-07-21 DIAGNOSIS — T148XXA Other injury of unspecified body region, initial encounter: Secondary | ICD-10-CM | POA: Diagnosis not present

## 2013-07-23 ENCOUNTER — Telehealth: Payer: Self-pay

## 2013-07-23 MED ORDER — LINACLOTIDE 290 MCG PO CAPS: 290.0000 ug | ORAL_CAPSULE | Freq: Every day | ORAL | Status: DC

## 2013-07-23 NOTE — Telephone Encounter (Signed)
I called pt and she said she does not need the prescription for the Linzess that she cannot afford it. She said that she was waiting on [place] to do some paperwork.  Per Almyra Free, she did for the Linzess on pt assistance at one time, then she had to use her insurance.  Per Almyra Free, the insurance has denied it , and she called back and [facility] for a return call tomorrow.

## 2013-07-25 NOTE — Telephone Encounter (Signed)
Patient informed and she voiced understanding and disconnected the call

## 2013-07-29 ENCOUNTER — Telehealth: Payer: Self-pay | Admitting: *Deleted

## 2013-07-29 NOTE — Telephone Encounter (Signed)
Tried to call pt- NA- LMOM- we have done patient assistance for this pt, we have done a PA for this pt and we have done a tier exception. The PA was approved at the higher tier but the tier exception was denied. The only thing we can do now is an appeal letter. Vicente Males, would you be willing to type an appeal letter for [place] for a tier exception?

## 2013-07-29 NOTE — Telephone Encounter (Signed)
Pt called today wanting to speak with Almyra Free about her medication, I made pt aware that Almyra Free will be in after lunch and she could return her call, pt said she wants to speak with the other Dr. Jaymes Graff nurse I told pt that Dr. Gala Romney and Dr. Oneida Alar do not see each others patients and I told pt that Almyra Free is Dr. Roseanne Kaufman nurse and she can call her back, pt asked if I can do anything about her medication I made pt aware that I can not because I am not a nurse, I am not authorized to do that. Pt hung up the phone.

## 2013-07-30 ENCOUNTER — Encounter: Payer: Self-pay | Admitting: Gastroenterology

## 2013-07-30 NOTE — Telephone Encounter (Signed)
Finished letter.

## 2013-07-30 NOTE — Telephone Encounter (Signed)
I checked [place] and linzess are both tier 3, the only other constipation medication is miralax which is tier 2

## 2013-07-30 NOTE — Telephone Encounter (Signed)
Letter faxed to the company for appeal

## 2013-09-02 DIAGNOSIS — F411 Generalized anxiety disorder: Secondary | ICD-10-CM | POA: Diagnosis not present

## 2013-09-02 DIAGNOSIS — E538 Deficiency of other specified B group vitamins: Secondary | ICD-10-CM | POA: Diagnosis not present

## 2013-09-02 DIAGNOSIS — E7800 Pure hypercholesterolemia, unspecified: Secondary | ICD-10-CM | POA: Diagnosis not present

## 2013-09-02 DIAGNOSIS — K219 Gastro-esophageal reflux disease without esophagitis: Secondary | ICD-10-CM | POA: Diagnosis not present

## 2013-09-02 DIAGNOSIS — I10 Essential (primary) hypertension: Secondary | ICD-10-CM | POA: Diagnosis not present

## 2013-09-02 DIAGNOSIS — N3090 Cystitis, unspecified without hematuria: Secondary | ICD-10-CM | POA: Diagnosis not present

## 2013-09-02 DIAGNOSIS — M353 Polymyalgia rheumatica: Secondary | ICD-10-CM | POA: Diagnosis not present

## 2013-09-02 DIAGNOSIS — E039 Hypothyroidism, unspecified: Secondary | ICD-10-CM | POA: Diagnosis not present

## 2013-09-03 DIAGNOSIS — Z79899 Other long term (current) drug therapy: Secondary | ICD-10-CM | POA: Diagnosis not present

## 2013-09-03 DIAGNOSIS — M545 Low back pain, unspecified: Secondary | ICD-10-CM | POA: Diagnosis not present

## 2013-09-03 DIAGNOSIS — M810 Age-related osteoporosis without current pathological fracture: Secondary | ICD-10-CM | POA: Diagnosis not present

## 2013-09-03 DIAGNOSIS — M353 Polymyalgia rheumatica: Secondary | ICD-10-CM | POA: Diagnosis not present

## 2013-09-08 DIAGNOSIS — E7800 Pure hypercholesterolemia, unspecified: Secondary | ICD-10-CM | POA: Diagnosis not present

## 2013-09-08 DIAGNOSIS — I872 Venous insufficiency (chronic) (peripheral): Secondary | ICD-10-CM | POA: Diagnosis not present

## 2013-09-08 DIAGNOSIS — I10 Essential (primary) hypertension: Secondary | ICD-10-CM | POA: Diagnosis not present

## 2013-09-08 DIAGNOSIS — S93409A Sprain of unspecified ligament of unspecified ankle, initial encounter: Secondary | ICD-10-CM | POA: Diagnosis not present

## 2013-09-08 DIAGNOSIS — R609 Edema, unspecified: Secondary | ICD-10-CM | POA: Diagnosis not present

## 2013-09-16 DIAGNOSIS — H02059 Trichiasis without entropian unspecified eye, unspecified eyelid: Secondary | ICD-10-CM | POA: Diagnosis not present

## 2013-09-16 DIAGNOSIS — J383 Other diseases of vocal cords: Secondary | ICD-10-CM | POA: Diagnosis not present

## 2013-09-16 DIAGNOSIS — J449 Chronic obstructive pulmonary disease, unspecified: Secondary | ICD-10-CM | POA: Diagnosis not present

## 2013-09-22 DIAGNOSIS — I10 Essential (primary) hypertension: Secondary | ICD-10-CM | POA: Diagnosis not present

## 2013-09-22 DIAGNOSIS — I2510 Atherosclerotic heart disease of native coronary artery without angina pectoris: Secondary | ICD-10-CM | POA: Diagnosis not present

## 2013-09-25 DIAGNOSIS — Z23 Encounter for immunization: Secondary | ICD-10-CM | POA: Diagnosis not present

## 2013-10-02 DIAGNOSIS — M545 Low back pain, unspecified: Secondary | ICD-10-CM | POA: Diagnosis not present

## 2013-10-02 DIAGNOSIS — M4120 Other idiopathic scoliosis, site unspecified: Secondary | ICD-10-CM | POA: Diagnosis not present

## 2013-10-15 DIAGNOSIS — Z85828 Personal history of other malignant neoplasm of skin: Secondary | ICD-10-CM | POA: Diagnosis not present

## 2013-10-15 DIAGNOSIS — L570 Actinic keratosis: Secondary | ICD-10-CM | POA: Diagnosis not present

## 2013-10-20 DIAGNOSIS — H409 Unspecified glaucoma: Secondary | ICD-10-CM | POA: Diagnosis not present

## 2013-10-20 DIAGNOSIS — Z87440 Personal history of urinary (tract) infections: Secondary | ICD-10-CM | POA: Diagnosis not present

## 2013-10-20 DIAGNOSIS — H4011X0 Primary open-angle glaucoma, stage unspecified: Secondary | ICD-10-CM | POA: Diagnosis not present

## 2013-10-20 DIAGNOSIS — N905 Atrophy of vulva: Secondary | ICD-10-CM | POA: Diagnosis not present

## 2013-10-20 DIAGNOSIS — K5900 Constipation, unspecified: Secondary | ICD-10-CM | POA: Diagnosis not present

## 2013-10-21 DIAGNOSIS — E039 Hypothyroidism, unspecified: Secondary | ICD-10-CM | POA: Diagnosis not present

## 2013-10-21 DIAGNOSIS — E538 Deficiency of other specified B group vitamins: Secondary | ICD-10-CM | POA: Diagnosis not present

## 2013-10-21 DIAGNOSIS — K219 Gastro-esophageal reflux disease without esophagitis: Secondary | ICD-10-CM | POA: Diagnosis not present

## 2013-10-21 DIAGNOSIS — E7800 Pure hypercholesterolemia, unspecified: Secondary | ICD-10-CM | POA: Diagnosis not present

## 2013-10-21 DIAGNOSIS — I10 Essential (primary) hypertension: Secondary | ICD-10-CM | POA: Diagnosis not present

## 2013-10-21 DIAGNOSIS — R5381 Other malaise: Secondary | ICD-10-CM | POA: Diagnosis not present

## 2013-10-21 DIAGNOSIS — M129 Arthropathy, unspecified: Secondary | ICD-10-CM | POA: Diagnosis not present

## 2013-10-22 DIAGNOSIS — R5383 Other fatigue: Secondary | ICD-10-CM | POA: Diagnosis not present

## 2013-10-22 DIAGNOSIS — R5381 Other malaise: Secondary | ICD-10-CM | POA: Diagnosis not present

## 2013-10-22 DIAGNOSIS — M353 Polymyalgia rheumatica: Secondary | ICD-10-CM | POA: Diagnosis not present

## 2013-10-22 DIAGNOSIS — E039 Hypothyroidism, unspecified: Secondary | ICD-10-CM | POA: Diagnosis not present

## 2013-10-22 DIAGNOSIS — J449 Chronic obstructive pulmonary disease, unspecified: Secondary | ICD-10-CM | POA: Diagnosis not present

## 2013-10-30 ENCOUNTER — Ambulatory Visit
Admission: RE | Admit: 2013-10-30 | Discharge: 2013-10-30 | Disposition: A | Discharge status: Home / Self Care | Payer: Medicare Other | Source: Ambulatory Visit | Attending: Rheumatology | Admitting: Rheumatology

## 2013-10-30 ENCOUNTER — Other Ambulatory Visit: Payer: Self-pay | Admitting: Rheumatology

## 2013-10-30 DIAGNOSIS — M546 Pain in thoracic spine: Secondary | ICD-10-CM

## 2013-10-30 DIAGNOSIS — M545 Low back pain, unspecified: Secondary | ICD-10-CM

## 2013-10-30 DIAGNOSIS — M549 Dorsalgia, unspecified: Secondary | ICD-10-CM

## 2013-10-30 DIAGNOSIS — M353 Polymyalgia rheumatica: Secondary | ICD-10-CM | POA: Diagnosis not present

## 2013-10-30 DIAGNOSIS — M4185 Other forms of scoliosis, thoracolumbar region: Secondary | ICD-10-CM | POA: Diagnosis not present

## 2013-10-30 DIAGNOSIS — G8929 Other chronic pain: Secondary | ICD-10-CM

## 2013-10-30 DIAGNOSIS — Z7952 Long term (current) use of systemic steroids: Secondary | ICD-10-CM | POA: Diagnosis not present

## 2013-10-30 DIAGNOSIS — M40204 Unspecified kyphosis, thoracic region: Secondary | ICD-10-CM | POA: Diagnosis not present

## 2013-11-06 DIAGNOSIS — M79675 Pain in left toe(s): Secondary | ICD-10-CM | POA: Diagnosis not present

## 2013-11-06 DIAGNOSIS — L600 Ingrowing nail: Secondary | ICD-10-CM | POA: Diagnosis not present

## 2013-11-07 DIAGNOSIS — G5711 Meralgia paresthetica, right lower limb: Secondary | ICD-10-CM | POA: Diagnosis not present

## 2013-11-19 DIAGNOSIS — L600 Ingrowing nail: Secondary | ICD-10-CM | POA: Diagnosis not present

## 2013-11-19 DIAGNOSIS — L03032 Cellulitis of left toe: Secondary | ICD-10-CM | POA: Diagnosis not present

## 2013-11-27 DIAGNOSIS — Z1231 Encounter for screening mammogram for malignant neoplasm of breast: Secondary | ICD-10-CM | POA: Diagnosis not present

## 2013-12-04 DIAGNOSIS — M9902 Segmental and somatic dysfunction of thoracic region: Secondary | ICD-10-CM | POA: Diagnosis not present

## 2013-12-04 DIAGNOSIS — M9903 Segmental and somatic dysfunction of lumbar region: Secondary | ICD-10-CM | POA: Diagnosis not present

## 2013-12-04 DIAGNOSIS — M4126 Other idiopathic scoliosis, lumbar region: Secondary | ICD-10-CM | POA: Diagnosis not present

## 2013-12-04 DIAGNOSIS — M4125 Other idiopathic scoliosis, thoracolumbar region: Secondary | ICD-10-CM | POA: Diagnosis not present

## 2013-12-04 DIAGNOSIS — M47814 Spondylosis without myelopathy or radiculopathy, thoracic region: Secondary | ICD-10-CM | POA: Diagnosis not present

## 2013-12-08 ENCOUNTER — Telehealth: Payer: Self-pay | Admitting: Internal Medicine

## 2013-12-08 DIAGNOSIS — I10 Essential (primary) hypertension: Secondary | ICD-10-CM | POA: Diagnosis not present

## 2013-12-08 DIAGNOSIS — J449 Chronic obstructive pulmonary disease, unspecified: Secondary | ICD-10-CM | POA: Diagnosis not present

## 2013-12-08 DIAGNOSIS — F329 Major depressive disorder, single episode, unspecified: Secondary | ICD-10-CM | POA: Diagnosis not present

## 2013-12-08 DIAGNOSIS — M47814 Spondylosis without myelopathy or radiculopathy, thoracic region: Secondary | ICD-10-CM | POA: Diagnosis not present

## 2013-12-08 DIAGNOSIS — M4124 Other idiopathic scoliosis, thoracic region: Secondary | ICD-10-CM | POA: Diagnosis not present

## 2013-12-08 DIAGNOSIS — M9902 Segmental and somatic dysfunction of thoracic region: Secondary | ICD-10-CM | POA: Diagnosis not present

## 2013-12-08 NOTE — Telephone Encounter (Signed)
Patient will be coming by today to drop off paper work to have filled out and would like samples of linzess

## 2013-12-10 DIAGNOSIS — M47814 Spondylosis without myelopathy or radiculopathy, thoracic region: Secondary | ICD-10-CM | POA: Diagnosis not present

## 2013-12-10 DIAGNOSIS — M4126 Other idiopathic scoliosis, lumbar region: Secondary | ICD-10-CM | POA: Diagnosis not present

## 2013-12-10 DIAGNOSIS — M9903 Segmental and somatic dysfunction of lumbar region: Secondary | ICD-10-CM | POA: Diagnosis not present

## 2013-12-10 DIAGNOSIS — M4125 Other idiopathic scoliosis, thoracolumbar region: Secondary | ICD-10-CM | POA: Diagnosis not present

## 2013-12-10 DIAGNOSIS — M9902 Segmental and somatic dysfunction of thoracic region: Secondary | ICD-10-CM | POA: Diagnosis not present

## 2013-12-12 NOTE — Telephone Encounter (Signed)
Paperwork is on AS desk. Samples are at the front desk.

## 2013-12-15 ENCOUNTER — Other Ambulatory Visit: Payer: Self-pay | Admitting: Gastroenterology

## 2013-12-15 DIAGNOSIS — M4124 Other idiopathic scoliosis, thoracic region: Secondary | ICD-10-CM | POA: Diagnosis not present

## 2013-12-15 DIAGNOSIS — M9902 Segmental and somatic dysfunction of thoracic region: Secondary | ICD-10-CM | POA: Diagnosis not present

## 2013-12-15 DIAGNOSIS — M47814 Spondylosis without myelopathy or radiculopathy, thoracic region: Secondary | ICD-10-CM | POA: Diagnosis not present

## 2013-12-15 MED ORDER — LINACLOTIDE 290 MCG PO CAPS: 290.0000 ug | ORAL_CAPSULE | Freq: Every day | ORAL | Status: AC

## 2013-12-15 MED ORDER — LINACLOTIDE 290 MCG PO CAPS: 290.0000 ug | ORAL_CAPSULE | Freq: Every day | ORAL | Status: DC

## 2013-12-17 DIAGNOSIS — L600 Ingrowing nail: Secondary | ICD-10-CM | POA: Diagnosis not present

## 2013-12-17 DIAGNOSIS — L03032 Cellulitis of left toe: Secondary | ICD-10-CM | POA: Diagnosis not present

## 2013-12-17 DIAGNOSIS — M79675 Pain in left toe(s): Secondary | ICD-10-CM | POA: Diagnosis not present

## 2013-12-18 DIAGNOSIS — M9902 Segmental and somatic dysfunction of thoracic region: Secondary | ICD-10-CM | POA: Diagnosis not present

## 2013-12-18 DIAGNOSIS — M4126 Other idiopathic scoliosis, lumbar region: Secondary | ICD-10-CM | POA: Diagnosis not present

## 2013-12-18 DIAGNOSIS — M47814 Spondylosis without myelopathy or radiculopathy, thoracic region: Secondary | ICD-10-CM | POA: Diagnosis not present

## 2013-12-18 DIAGNOSIS — M9903 Segmental and somatic dysfunction of lumbar region: Secondary | ICD-10-CM | POA: Diagnosis not present

## 2013-12-18 DIAGNOSIS — M4125 Other idiopathic scoliosis, thoracolumbar region: Secondary | ICD-10-CM | POA: Diagnosis not present

## 2013-12-23 DIAGNOSIS — M47814 Spondylosis without myelopathy or radiculopathy, thoracic region: Secondary | ICD-10-CM | POA: Diagnosis not present

## 2013-12-23 DIAGNOSIS — M4126 Other idiopathic scoliosis, lumbar region: Secondary | ICD-10-CM | POA: Diagnosis not present

## 2013-12-23 DIAGNOSIS — M4125 Other idiopathic scoliosis, thoracolumbar region: Secondary | ICD-10-CM | POA: Diagnosis not present

## 2013-12-23 DIAGNOSIS — M9903 Segmental and somatic dysfunction of lumbar region: Secondary | ICD-10-CM | POA: Diagnosis not present

## 2013-12-23 DIAGNOSIS — M9902 Segmental and somatic dysfunction of thoracic region: Secondary | ICD-10-CM | POA: Diagnosis not present

## 2013-12-30 DIAGNOSIS — M4125 Other idiopathic scoliosis, thoracolumbar region: Secondary | ICD-10-CM | POA: Diagnosis not present

## 2013-12-30 DIAGNOSIS — M9902 Segmental and somatic dysfunction of thoracic region: Secondary | ICD-10-CM | POA: Diagnosis not present

## 2013-12-30 DIAGNOSIS — M9903 Segmental and somatic dysfunction of lumbar region: Secondary | ICD-10-CM | POA: Diagnosis not present

## 2013-12-30 DIAGNOSIS — M47814 Spondylosis without myelopathy or radiculopathy, thoracic region: Secondary | ICD-10-CM | POA: Diagnosis not present

## 2013-12-30 DIAGNOSIS — M4126 Other idiopathic scoliosis, lumbar region: Secondary | ICD-10-CM | POA: Diagnosis not present

## 2013-12-31 ENCOUNTER — Telehealth: Payer: Self-pay | Admitting: Internal Medicine

## 2013-12-31 NOTE — Telephone Encounter (Signed)
[employer] called regarding patient's assistance papers. They received page 1 and the prescription, but did not get page 2 with the signature page. She asked if we could fax that to [phone] and make sure on the cover sheet to put patient's name and DOB.

## 2014-01-01 NOTE — Telephone Encounter (Signed)
I have refaxed all the patient assistance paperwork to the company.

## 2014-01-02 DIAGNOSIS — H04123 Dry eye syndrome of bilateral lacrimal glands: Secondary | ICD-10-CM | POA: Diagnosis not present

## 2014-01-06 DIAGNOSIS — M545 Low back pain: Secondary | ICD-10-CM | POA: Diagnosis not present

## 2014-01-06 DIAGNOSIS — M810 Age-related osteoporosis without current pathological fracture: Secondary | ICD-10-CM | POA: Diagnosis not present

## 2014-01-07 DIAGNOSIS — M47814 Spondylosis without myelopathy or radiculopathy, thoracic region: Secondary | ICD-10-CM | POA: Diagnosis not present

## 2014-01-07 DIAGNOSIS — M9902 Segmental and somatic dysfunction of thoracic region: Secondary | ICD-10-CM | POA: Diagnosis not present

## 2014-01-07 DIAGNOSIS — M4126 Other idiopathic scoliosis, lumbar region: Secondary | ICD-10-CM | POA: Diagnosis not present

## 2014-01-07 DIAGNOSIS — M9903 Segmental and somatic dysfunction of lumbar region: Secondary | ICD-10-CM | POA: Diagnosis not present

## 2014-01-07 DIAGNOSIS — M4125 Other idiopathic scoliosis, thoracolumbar region: Secondary | ICD-10-CM | POA: Diagnosis not present

## 2014-01-12 DIAGNOSIS — M4126 Other idiopathic scoliosis, lumbar region: Secondary | ICD-10-CM | POA: Diagnosis not present

## 2014-01-12 DIAGNOSIS — M4125 Other idiopathic scoliosis, thoracolumbar region: Secondary | ICD-10-CM | POA: Diagnosis not present

## 2014-01-12 DIAGNOSIS — M9902 Segmental and somatic dysfunction of thoracic region: Secondary | ICD-10-CM | POA: Diagnosis not present

## 2014-01-12 DIAGNOSIS — M9903 Segmental and somatic dysfunction of lumbar region: Secondary | ICD-10-CM | POA: Diagnosis not present

## 2014-01-12 DIAGNOSIS — M47814 Spondylosis without myelopathy or radiculopathy, thoracic region: Secondary | ICD-10-CM | POA: Diagnosis not present

## 2014-01-13 DIAGNOSIS — Z85828 Personal history of other malignant neoplasm of skin: Secondary | ICD-10-CM | POA: Diagnosis not present

## 2014-01-13 DIAGNOSIS — L570 Actinic keratosis: Secondary | ICD-10-CM | POA: Diagnosis not present

## 2014-01-19 ENCOUNTER — Telehealth: Payer: Self-pay | Admitting: Internal Medicine

## 2014-01-19 DIAGNOSIS — H4011X3 Primary open-angle glaucoma, severe stage: Secondary | ICD-10-CM | POA: Diagnosis not present

## 2014-01-19 DIAGNOSIS — M9903 Segmental and somatic dysfunction of lumbar region: Secondary | ICD-10-CM | POA: Diagnosis not present

## 2014-01-19 DIAGNOSIS — I10 Essential (primary) hypertension: Secondary | ICD-10-CM | POA: Diagnosis not present

## 2014-01-19 DIAGNOSIS — Z961 Presence of intraocular lens: Secondary | ICD-10-CM | POA: Diagnosis not present

## 2014-01-19 DIAGNOSIS — J449 Chronic obstructive pulmonary disease, unspecified: Secondary | ICD-10-CM | POA: Diagnosis not present

## 2014-01-19 DIAGNOSIS — F419 Anxiety disorder, unspecified: Secondary | ICD-10-CM | POA: Diagnosis not present

## 2014-01-19 DIAGNOSIS — M4126 Other idiopathic scoliosis, lumbar region: Secondary | ICD-10-CM | POA: Diagnosis not present

## 2014-01-19 DIAGNOSIS — M47814 Spondylosis without myelopathy or radiculopathy, thoracic region: Secondary | ICD-10-CM | POA: Diagnosis not present

## 2014-01-19 DIAGNOSIS — M4125 Other idiopathic scoliosis, thoracolumbar region: Secondary | ICD-10-CM | POA: Diagnosis not present

## 2014-01-19 DIAGNOSIS — H02413 Mechanical ptosis of bilateral eyelids: Secondary | ICD-10-CM | POA: Diagnosis not present

## 2014-01-19 DIAGNOSIS — M9902 Segmental and somatic dysfunction of thoracic region: Secondary | ICD-10-CM | POA: Diagnosis not present

## 2014-01-19 NOTE — Telephone Encounter (Signed)
PATIENT CAME IN INQUIRING ABOUT LINZESS SAMPLES

## 2014-01-19 NOTE — Telephone Encounter (Signed)
PT has not been seen within 6 months. Therefore I did not give her any samples. Asked Erline Levine to make her an appointment.

## 2014-01-20 DIAGNOSIS — Z961 Presence of intraocular lens: Secondary | ICD-10-CM | POA: Diagnosis not present

## 2014-01-26 DIAGNOSIS — M4126 Other idiopathic scoliosis, lumbar region: Secondary | ICD-10-CM | POA: Diagnosis not present

## 2014-01-26 DIAGNOSIS — M9903 Segmental and somatic dysfunction of lumbar region: Secondary | ICD-10-CM | POA: Diagnosis not present

## 2014-01-26 DIAGNOSIS — M47814 Spondylosis without myelopathy or radiculopathy, thoracic region: Secondary | ICD-10-CM | POA: Diagnosis not present

## 2014-01-26 DIAGNOSIS — M4125 Other idiopathic scoliosis, thoracolumbar region: Secondary | ICD-10-CM | POA: Diagnosis not present

## 2014-01-26 DIAGNOSIS — M9902 Segmental and somatic dysfunction of thoracic region: Secondary | ICD-10-CM | POA: Diagnosis not present

## 2014-02-03 DIAGNOSIS — M4125 Other idiopathic scoliosis, thoracolumbar region: Secondary | ICD-10-CM | POA: Diagnosis not present

## 2014-02-03 DIAGNOSIS — M9903 Segmental and somatic dysfunction of lumbar region: Secondary | ICD-10-CM | POA: Diagnosis not present

## 2014-02-03 DIAGNOSIS — M47814 Spondylosis without myelopathy or radiculopathy, thoracic region: Secondary | ICD-10-CM | POA: Diagnosis not present

## 2014-02-03 DIAGNOSIS — M9902 Segmental and somatic dysfunction of thoracic region: Secondary | ICD-10-CM | POA: Diagnosis not present

## 2014-02-03 DIAGNOSIS — M4126 Other idiopathic scoliosis, lumbar region: Secondary | ICD-10-CM | POA: Diagnosis not present

## 2014-02-05 DIAGNOSIS — M47812 Spondylosis without myelopathy or radiculopathy, cervical region: Secondary | ICD-10-CM | POA: Diagnosis not present

## 2014-02-05 DIAGNOSIS — M479 Spondylosis, unspecified: Secondary | ICD-10-CM | POA: Diagnosis not present

## 2014-02-05 DIAGNOSIS — I213 ST elevation (STEMI) myocardial infarction of unspecified site: Secondary | ICD-10-CM | POA: Diagnosis not present

## 2014-02-05 DIAGNOSIS — R269 Unspecified abnormalities of gait and mobility: Secondary | ICD-10-CM | POA: Diagnosis not present

## 2014-02-05 DIAGNOSIS — M47892 Other spondylosis, cervical region: Secondary | ICD-10-CM | POA: Diagnosis not present

## 2014-02-05 DIAGNOSIS — I2510 Atherosclerotic heart disease of native coronary artery without angina pectoris: Secondary | ICD-10-CM | POA: Diagnosis not present

## 2014-02-05 DIAGNOSIS — M5134 Other intervertebral disc degeneration, thoracic region: Secondary | ICD-10-CM | POA: Diagnosis not present

## 2014-02-05 DIAGNOSIS — R0602 Shortness of breath: Secondary | ICD-10-CM | POA: Diagnosis not present

## 2014-02-05 DIAGNOSIS — Z950 Presence of cardiac pacemaker: Secondary | ICD-10-CM | POA: Diagnosis not present

## 2014-02-05 DIAGNOSIS — M47814 Spondylosis without myelopathy or radiculopathy, thoracic region: Secondary | ICD-10-CM | POA: Diagnosis not present

## 2014-02-05 DIAGNOSIS — R531 Weakness: Secondary | ICD-10-CM | POA: Diagnosis not present

## 2014-02-05 DIAGNOSIS — J449 Chronic obstructive pulmonary disease, unspecified: Secondary | ICD-10-CM | POA: Diagnosis not present

## 2014-02-05 DIAGNOSIS — Z955 Presence of coronary angioplasty implant and graft: Secondary | ICD-10-CM | POA: Diagnosis not present

## 2014-02-05 DIAGNOSIS — I10 Essential (primary) hypertension: Secondary | ICD-10-CM | POA: Diagnosis not present

## 2014-02-05 DIAGNOSIS — I509 Heart failure, unspecified: Secondary | ICD-10-CM | POA: Diagnosis not present

## 2014-02-05 DIAGNOSIS — I209 Angina pectoris, unspecified: Secondary | ICD-10-CM | POA: Diagnosis not present

## 2014-02-05 DIAGNOSIS — K589 Irritable bowel syndrome without diarrhea: Secondary | ICD-10-CM | POA: Diagnosis not present

## 2014-02-05 DIAGNOSIS — M5032 Other cervical disc degeneration, mid-cervical region: Secondary | ICD-10-CM | POA: Diagnosis not present

## 2014-02-05 DIAGNOSIS — K219 Gastro-esophageal reflux disease without esophagitis: Secondary | ICD-10-CM | POA: Diagnosis not present

## 2014-02-06 DIAGNOSIS — R001 Bradycardia, unspecified: Secondary | ICD-10-CM | POA: Diagnosis not present

## 2014-02-09 DIAGNOSIS — R0609 Other forms of dyspnea: Secondary | ICD-10-CM | POA: Diagnosis not present

## 2014-02-09 DIAGNOSIS — Z950 Presence of cardiac pacemaker: Secondary | ICD-10-CM | POA: Diagnosis not present

## 2014-02-09 DIAGNOSIS — J449 Chronic obstructive pulmonary disease, unspecified: Secondary | ICD-10-CM | POA: Diagnosis not present

## 2014-02-09 DIAGNOSIS — K219 Gastro-esophageal reflux disease without esophagitis: Secondary | ICD-10-CM | POA: Diagnosis not present

## 2014-02-09 DIAGNOSIS — I2510 Atherosclerotic heart disease of native coronary artery without angina pectoris: Secondary | ICD-10-CM | POA: Diagnosis not present

## 2014-02-09 DIAGNOSIS — I442 Atrioventricular block, complete: Secondary | ICD-10-CM | POA: Diagnosis not present

## 2014-02-19 DIAGNOSIS — I442 Atrioventricular block, complete: Secondary | ICD-10-CM | POA: Diagnosis not present

## 2014-02-20 ENCOUNTER — Ambulatory Visit: Payer: Medicare Other | Admitting: Internal Medicine

## 2014-03-03 DIAGNOSIS — E039 Hypothyroidism, unspecified: Secondary | ICD-10-CM | POA: Diagnosis not present

## 2014-03-04 DIAGNOSIS — M419 Scoliosis, unspecified: Secondary | ICD-10-CM | POA: Diagnosis not present

## 2014-03-04 DIAGNOSIS — M4120 Other idiopathic scoliosis, site unspecified: Secondary | ICD-10-CM | POA: Diagnosis not present

## 2014-03-04 DIAGNOSIS — M47812 Spondylosis without myelopathy or radiculopathy, cervical region: Secondary | ICD-10-CM | POA: Diagnosis not present

## 2014-03-10 ENCOUNTER — Ambulatory Visit: Payer: Medicare Other | Admitting: Internal Medicine

## 2014-03-10 DIAGNOSIS — Z7982 Long term (current) use of aspirin: Secondary | ICD-10-CM | POA: Diagnosis not present

## 2014-03-10 DIAGNOSIS — R0600 Dyspnea, unspecified: Secondary | ICD-10-CM | POA: Diagnosis not present

## 2014-03-10 DIAGNOSIS — K219 Gastro-esophageal reflux disease without esophagitis: Secondary | ICD-10-CM | POA: Diagnosis not present

## 2014-03-10 DIAGNOSIS — I2510 Atherosclerotic heart disease of native coronary artery without angina pectoris: Secondary | ICD-10-CM | POA: Diagnosis not present

## 2014-03-10 DIAGNOSIS — J449 Chronic obstructive pulmonary disease, unspecified: Secondary | ICD-10-CM | POA: Diagnosis not present

## 2014-03-10 DIAGNOSIS — M353 Polymyalgia rheumatica: Secondary | ICD-10-CM | POA: Diagnosis not present

## 2014-03-10 DIAGNOSIS — R0602 Shortness of breath: Secondary | ICD-10-CM | POA: Insufficient documentation

## 2014-03-10 DIAGNOSIS — I10 Essential (primary) hypertension: Secondary | ICD-10-CM | POA: Diagnosis not present

## 2014-03-19 DIAGNOSIS — E780 Pure hypercholesterolemia: Secondary | ICD-10-CM | POA: Diagnosis not present

## 2014-03-19 DIAGNOSIS — I10 Essential (primary) hypertension: Secondary | ICD-10-CM | POA: Diagnosis not present

## 2014-03-19 DIAGNOSIS — E039 Hypothyroidism, unspecified: Secondary | ICD-10-CM | POA: Diagnosis not present

## 2014-03-30 DIAGNOSIS — J449 Chronic obstructive pulmonary disease, unspecified: Secondary | ICD-10-CM | POA: Diagnosis not present

## 2014-03-30 DIAGNOSIS — M419 Scoliosis, unspecified: Secondary | ICD-10-CM | POA: Diagnosis not present

## 2014-03-30 DIAGNOSIS — E039 Hypothyroidism, unspecified: Secondary | ICD-10-CM | POA: Diagnosis not present

## 2014-04-13 DIAGNOSIS — S0500XA Injury of conjunctiva and corneal abrasion without foreign body, unspecified eye, initial encounter: Secondary | ICD-10-CM | POA: Diagnosis not present

## 2014-04-16 DIAGNOSIS — M419 Scoliosis, unspecified: Secondary | ICD-10-CM | POA: Diagnosis not present

## 2014-04-16 DIAGNOSIS — M4120 Other idiopathic scoliosis, site unspecified: Secondary | ICD-10-CM | POA: Diagnosis not present

## 2014-04-16 DIAGNOSIS — M47812 Spondylosis without myelopathy or radiculopathy, cervical region: Secondary | ICD-10-CM | POA: Diagnosis not present

## 2014-04-17 ENCOUNTER — Ambulatory Visit: Payer: Medicare Other | Admitting: Internal Medicine

## 2014-04-30 DIAGNOSIS — E039 Hypothyroidism, unspecified: Secondary | ICD-10-CM | POA: Diagnosis not present

## 2014-04-30 DIAGNOSIS — E780 Pure hypercholesterolemia: Secondary | ICD-10-CM | POA: Diagnosis not present

## 2014-04-30 DIAGNOSIS — I10 Essential (primary) hypertension: Secondary | ICD-10-CM | POA: Diagnosis not present

## 2014-05-04 DIAGNOSIS — J449 Chronic obstructive pulmonary disease, unspecified: Secondary | ICD-10-CM | POA: Diagnosis not present

## 2014-05-04 DIAGNOSIS — E039 Hypothyroidism, unspecified: Secondary | ICD-10-CM | POA: Diagnosis not present

## 2014-05-04 DIAGNOSIS — Z961 Presence of intraocular lens: Secondary | ICD-10-CM | POA: Diagnosis not present

## 2014-05-04 DIAGNOSIS — H4011X3 Primary open-angle glaucoma, severe stage: Secondary | ICD-10-CM | POA: Diagnosis not present

## 2014-05-04 DIAGNOSIS — E780 Pure hypercholesterolemia: Secondary | ICD-10-CM | POA: Diagnosis not present

## 2014-05-04 DIAGNOSIS — H40043 Steroid responder, bilateral: Secondary | ICD-10-CM | POA: Diagnosis not present

## 2014-05-12 DIAGNOSIS — Z955 Presence of coronary angioplasty implant and graft: Secondary | ICD-10-CM | POA: Diagnosis not present

## 2014-05-12 DIAGNOSIS — I10 Essential (primary) hypertension: Secondary | ICD-10-CM | POA: Diagnosis not present

## 2014-05-12 DIAGNOSIS — I2510 Atherosclerotic heart disease of native coronary artery without angina pectoris: Secondary | ICD-10-CM | POA: Diagnosis not present

## 2014-05-14 ENCOUNTER — Telehealth: Payer: Self-pay

## 2014-05-14 NOTE — Telephone Encounter (Signed)
Spoke with the pt- patient assistance medication (Linzess 273mcg) arrived today. Pt will pick it up next week.

## 2014-05-21 DIAGNOSIS — K210 Gastro-esophageal reflux disease with esophagitis: Secondary | ICD-10-CM | POA: Diagnosis not present

## 2014-05-21 DIAGNOSIS — J449 Chronic obstructive pulmonary disease, unspecified: Secondary | ICD-10-CM | POA: Diagnosis not present

## 2014-05-21 DIAGNOSIS — I10 Essential (primary) hypertension: Secondary | ICD-10-CM | POA: Diagnosis not present

## 2014-05-25 DIAGNOSIS — L570 Actinic keratosis: Secondary | ICD-10-CM | POA: Diagnosis not present

## 2014-05-25 DIAGNOSIS — M47812 Spondylosis without myelopathy or radiculopathy, cervical region: Secondary | ICD-10-CM | POA: Diagnosis not present

## 2014-05-25 DIAGNOSIS — M47814 Spondylosis without myelopathy or radiculopathy, thoracic region: Secondary | ICD-10-CM | POA: Diagnosis not present

## 2014-05-25 DIAGNOSIS — M9903 Segmental and somatic dysfunction of lumbar region: Secondary | ICD-10-CM | POA: Diagnosis not present

## 2014-05-25 DIAGNOSIS — M4125 Other idiopathic scoliosis, thoracolumbar region: Secondary | ICD-10-CM | POA: Diagnosis not present

## 2014-05-25 DIAGNOSIS — M4126 Other idiopathic scoliosis, lumbar region: Secondary | ICD-10-CM | POA: Diagnosis not present

## 2014-05-25 DIAGNOSIS — M9902 Segmental and somatic dysfunction of thoracic region: Secondary | ICD-10-CM | POA: Diagnosis not present

## 2014-05-25 DIAGNOSIS — L821 Other seborrheic keratosis: Secondary | ICD-10-CM | POA: Diagnosis not present

## 2014-05-25 DIAGNOSIS — M9901 Segmental and somatic dysfunction of cervical region: Secondary | ICD-10-CM | POA: Diagnosis not present

## 2014-05-28 DIAGNOSIS — M4125 Other idiopathic scoliosis, thoracolumbar region: Secondary | ICD-10-CM | POA: Diagnosis not present

## 2014-05-28 DIAGNOSIS — M47812 Spondylosis without myelopathy or radiculopathy, cervical region: Secondary | ICD-10-CM | POA: Diagnosis not present

## 2014-05-28 DIAGNOSIS — M47816 Spondylosis without myelopathy or radiculopathy, lumbar region: Secondary | ICD-10-CM | POA: Diagnosis not present

## 2014-05-28 DIAGNOSIS — M47814 Spondylosis without myelopathy or radiculopathy, thoracic region: Secondary | ICD-10-CM | POA: Diagnosis not present

## 2014-05-28 DIAGNOSIS — M9902 Segmental and somatic dysfunction of thoracic region: Secondary | ICD-10-CM | POA: Diagnosis not present

## 2014-05-28 DIAGNOSIS — M9903 Segmental and somatic dysfunction of lumbar region: Secondary | ICD-10-CM | POA: Diagnosis not present

## 2014-05-28 DIAGNOSIS — M9901 Segmental and somatic dysfunction of cervical region: Secondary | ICD-10-CM | POA: Diagnosis not present

## 2014-05-28 DIAGNOSIS — M4126 Other idiopathic scoliosis, lumbar region: Secondary | ICD-10-CM | POA: Diagnosis not present

## 2014-06-02 DIAGNOSIS — M9902 Segmental and somatic dysfunction of thoracic region: Secondary | ICD-10-CM | POA: Diagnosis not present

## 2014-06-02 DIAGNOSIS — M47814 Spondylosis without myelopathy or radiculopathy, thoracic region: Secondary | ICD-10-CM | POA: Diagnosis not present

## 2014-06-02 DIAGNOSIS — M4125 Other idiopathic scoliosis, thoracolumbar region: Secondary | ICD-10-CM | POA: Diagnosis not present

## 2014-06-02 DIAGNOSIS — M9901 Segmental and somatic dysfunction of cervical region: Secondary | ICD-10-CM | POA: Diagnosis not present

## 2014-06-02 DIAGNOSIS — M47816 Spondylosis without myelopathy or radiculopathy, lumbar region: Secondary | ICD-10-CM | POA: Diagnosis not present

## 2014-06-02 DIAGNOSIS — M9903 Segmental and somatic dysfunction of lumbar region: Secondary | ICD-10-CM | POA: Diagnosis not present

## 2014-06-02 DIAGNOSIS — M47812 Spondylosis without myelopathy or radiculopathy, cervical region: Secondary | ICD-10-CM | POA: Diagnosis not present

## 2014-06-02 DIAGNOSIS — M4126 Other idiopathic scoliosis, lumbar region: Secondary | ICD-10-CM | POA: Diagnosis not present

## 2014-06-05 DIAGNOSIS — M47814 Spondylosis without myelopathy or radiculopathy, thoracic region: Secondary | ICD-10-CM | POA: Diagnosis not present

## 2014-06-05 DIAGNOSIS — M4125 Other idiopathic scoliosis, thoracolumbar region: Secondary | ICD-10-CM | POA: Diagnosis not present

## 2014-06-05 DIAGNOSIS — M9903 Segmental and somatic dysfunction of lumbar region: Secondary | ICD-10-CM | POA: Diagnosis not present

## 2014-06-05 DIAGNOSIS — M47816 Spondylosis without myelopathy or radiculopathy, lumbar region: Secondary | ICD-10-CM | POA: Diagnosis not present

## 2014-06-05 DIAGNOSIS — M9901 Segmental and somatic dysfunction of cervical region: Secondary | ICD-10-CM | POA: Diagnosis not present

## 2014-06-05 DIAGNOSIS — M9902 Segmental and somatic dysfunction of thoracic region: Secondary | ICD-10-CM | POA: Diagnosis not present

## 2014-06-05 DIAGNOSIS — M4126 Other idiopathic scoliosis, lumbar region: Secondary | ICD-10-CM | POA: Diagnosis not present

## 2014-06-05 DIAGNOSIS — M47812 Spondylosis without myelopathy or radiculopathy, cervical region: Secondary | ICD-10-CM | POA: Diagnosis not present

## 2014-06-09 DIAGNOSIS — M47816 Spondylosis without myelopathy or radiculopathy, lumbar region: Secondary | ICD-10-CM | POA: Diagnosis not present

## 2014-06-09 DIAGNOSIS — M4125 Other idiopathic scoliosis, thoracolumbar region: Secondary | ICD-10-CM | POA: Diagnosis not present

## 2014-06-09 DIAGNOSIS — M9903 Segmental and somatic dysfunction of lumbar region: Secondary | ICD-10-CM | POA: Diagnosis not present

## 2014-06-09 DIAGNOSIS — M47814 Spondylosis without myelopathy or radiculopathy, thoracic region: Secondary | ICD-10-CM | POA: Diagnosis not present

## 2014-06-09 DIAGNOSIS — M9902 Segmental and somatic dysfunction of thoracic region: Secondary | ICD-10-CM | POA: Diagnosis not present

## 2014-06-09 DIAGNOSIS — M545 Low back pain: Secondary | ICD-10-CM | POA: Diagnosis not present

## 2014-06-09 DIAGNOSIS — M4126 Other idiopathic scoliosis, lumbar region: Secondary | ICD-10-CM | POA: Diagnosis not present

## 2014-06-09 DIAGNOSIS — M47812 Spondylosis without myelopathy or radiculopathy, cervical region: Secondary | ICD-10-CM | POA: Diagnosis not present

## 2014-06-09 DIAGNOSIS — M9901 Segmental and somatic dysfunction of cervical region: Secondary | ICD-10-CM | POA: Diagnosis not present

## 2014-06-12 DIAGNOSIS — M4126 Other idiopathic scoliosis, lumbar region: Secondary | ICD-10-CM | POA: Diagnosis not present

## 2014-06-12 DIAGNOSIS — M47814 Spondylosis without myelopathy or radiculopathy, thoracic region: Secondary | ICD-10-CM | POA: Diagnosis not present

## 2014-06-12 DIAGNOSIS — M9902 Segmental and somatic dysfunction of thoracic region: Secondary | ICD-10-CM | POA: Diagnosis not present

## 2014-06-12 DIAGNOSIS — M9903 Segmental and somatic dysfunction of lumbar region: Secondary | ICD-10-CM | POA: Diagnosis not present

## 2014-06-12 DIAGNOSIS — M47812 Spondylosis without myelopathy or radiculopathy, cervical region: Secondary | ICD-10-CM | POA: Diagnosis not present

## 2014-06-12 DIAGNOSIS — M545 Low back pain: Secondary | ICD-10-CM | POA: Diagnosis not present

## 2014-06-12 DIAGNOSIS — M4125 Other idiopathic scoliosis, thoracolumbar region: Secondary | ICD-10-CM | POA: Diagnosis not present

## 2014-06-12 DIAGNOSIS — M47816 Spondylosis without myelopathy or radiculopathy, lumbar region: Secondary | ICD-10-CM | POA: Diagnosis not present

## 2014-06-12 DIAGNOSIS — M9901 Segmental and somatic dysfunction of cervical region: Secondary | ICD-10-CM | POA: Diagnosis not present

## 2014-06-15 DIAGNOSIS — K5901 Slow transit constipation: Secondary | ICD-10-CM | POA: Diagnosis not present

## 2014-06-15 DIAGNOSIS — N905 Atrophy of vulva: Secondary | ICD-10-CM | POA: Diagnosis not present

## 2014-06-15 DIAGNOSIS — R3915 Urgency of urination: Secondary | ICD-10-CM | POA: Diagnosis not present

## 2014-06-18 DIAGNOSIS — M47814 Spondylosis without myelopathy or radiculopathy, thoracic region: Secondary | ICD-10-CM | POA: Diagnosis not present

## 2014-06-18 DIAGNOSIS — M47812 Spondylosis without myelopathy or radiculopathy, cervical region: Secondary | ICD-10-CM | POA: Diagnosis not present

## 2014-06-18 DIAGNOSIS — M9902 Segmental and somatic dysfunction of thoracic region: Secondary | ICD-10-CM | POA: Diagnosis not present

## 2014-06-18 DIAGNOSIS — M9903 Segmental and somatic dysfunction of lumbar region: Secondary | ICD-10-CM | POA: Diagnosis not present

## 2014-06-18 DIAGNOSIS — M47816 Spondylosis without myelopathy or radiculopathy, lumbar region: Secondary | ICD-10-CM | POA: Diagnosis not present

## 2014-06-18 DIAGNOSIS — M545 Low back pain: Secondary | ICD-10-CM | POA: Diagnosis not present

## 2014-06-18 DIAGNOSIS — M9901 Segmental and somatic dysfunction of cervical region: Secondary | ICD-10-CM | POA: Diagnosis not present

## 2014-06-18 DIAGNOSIS — M4126 Other idiopathic scoliosis, lumbar region: Secondary | ICD-10-CM | POA: Diagnosis not present

## 2014-06-18 DIAGNOSIS — M4125 Other idiopathic scoliosis, thoracolumbar region: Secondary | ICD-10-CM | POA: Diagnosis not present

## 2014-06-19 DIAGNOSIS — H01001 Unspecified blepharitis right upper eyelid: Secondary | ICD-10-CM | POA: Diagnosis not present

## 2014-06-19 DIAGNOSIS — H01004 Unspecified blepharitis left upper eyelid: Secondary | ICD-10-CM | POA: Diagnosis not present

## 2014-06-25 DIAGNOSIS — H16143 Punctate keratitis, bilateral: Secondary | ICD-10-CM | POA: Diagnosis not present

## 2014-06-30 DIAGNOSIS — H01002 Unspecified blepharitis right lower eyelid: Secondary | ICD-10-CM | POA: Diagnosis not present

## 2014-07-06 DIAGNOSIS — H40043 Steroid responder, bilateral: Secondary | ICD-10-CM | POA: Diagnosis not present

## 2014-07-06 DIAGNOSIS — H4011X4 Primary open-angle glaucoma, indeterminate stage: Secondary | ICD-10-CM | POA: Diagnosis not present

## 2014-07-28 DIAGNOSIS — M6281 Muscle weakness (generalized): Secondary | ICD-10-CM | POA: Diagnosis not present

## 2014-07-28 DIAGNOSIS — R7989 Other specified abnormal findings of blood chemistry: Secondary | ICD-10-CM | POA: Diagnosis not present

## 2014-07-28 DIAGNOSIS — Z7952 Long term (current) use of systemic steroids: Secondary | ICD-10-CM | POA: Diagnosis not present

## 2014-07-28 DIAGNOSIS — J441 Chronic obstructive pulmonary disease with (acute) exacerbation: Secondary | ICD-10-CM | POA: Diagnosis not present

## 2014-07-28 DIAGNOSIS — Z885 Allergy status to narcotic agent status: Secondary | ICD-10-CM | POA: Diagnosis not present

## 2014-07-28 DIAGNOSIS — Z7951 Long term (current) use of inhaled steroids: Secondary | ICD-10-CM | POA: Diagnosis not present

## 2014-07-28 DIAGNOSIS — G2581 Restless legs syndrome: Secondary | ICD-10-CM | POA: Diagnosis not present

## 2014-07-28 DIAGNOSIS — I5033 Acute on chronic diastolic (congestive) heart failure: Secondary | ICD-10-CM | POA: Diagnosis not present

## 2014-07-28 DIAGNOSIS — R278 Other lack of coordination: Secondary | ICD-10-CM | POA: Diagnosis not present

## 2014-07-28 DIAGNOSIS — I209 Angina pectoris, unspecified: Secondary | ICD-10-CM | POA: Diagnosis not present

## 2014-07-28 DIAGNOSIS — R0600 Dyspnea, unspecified: Secondary | ICD-10-CM | POA: Diagnosis not present

## 2014-07-28 DIAGNOSIS — I509 Heart failure, unspecified: Secondary | ICD-10-CM | POA: Diagnosis not present

## 2014-07-28 DIAGNOSIS — F329 Major depressive disorder, single episode, unspecified: Secondary | ICD-10-CM | POA: Diagnosis not present

## 2014-07-28 DIAGNOSIS — Z882 Allergy status to sulfonamides status: Secondary | ICD-10-CM | POA: Diagnosis not present

## 2014-07-28 DIAGNOSIS — R0602 Shortness of breath: Secondary | ICD-10-CM | POA: Diagnosis not present

## 2014-07-28 DIAGNOSIS — R069 Unspecified abnormalities of breathing: Secondary | ICD-10-CM | POA: Diagnosis not present

## 2014-07-28 DIAGNOSIS — I341 Nonrheumatic mitral (valve) prolapse: Secondary | ICD-10-CM | POA: Diagnosis not present

## 2014-07-28 DIAGNOSIS — Z7982 Long term (current) use of aspirin: Secondary | ICD-10-CM | POA: Diagnosis not present

## 2014-07-28 DIAGNOSIS — K5900 Constipation, unspecified: Secondary | ICD-10-CM | POA: Diagnosis not present

## 2014-07-28 DIAGNOSIS — H409 Unspecified glaucoma: Secondary | ICD-10-CM | POA: Diagnosis present

## 2014-07-28 DIAGNOSIS — I517 Cardiomegaly: Secondary | ICD-10-CM | POA: Diagnosis not present

## 2014-07-28 DIAGNOSIS — E785 Hyperlipidemia, unspecified: Secondary | ICD-10-CM | POA: Diagnosis not present

## 2014-07-28 DIAGNOSIS — Z79899 Other long term (current) drug therapy: Secondary | ICD-10-CM | POA: Diagnosis not present

## 2014-07-28 DIAGNOSIS — Z880 Allergy status to penicillin: Secondary | ICD-10-CM | POA: Diagnosis not present

## 2014-07-28 DIAGNOSIS — I10 Essential (primary) hypertension: Secondary | ICD-10-CM | POA: Diagnosis not present

## 2014-07-28 DIAGNOSIS — I4891 Unspecified atrial fibrillation: Secondary | ICD-10-CM | POA: Diagnosis present

## 2014-07-28 DIAGNOSIS — Z888 Allergy status to other drugs, medicaments and biological substances status: Secondary | ICD-10-CM | POA: Diagnosis not present

## 2014-07-28 DIAGNOSIS — R748 Abnormal levels of other serum enzymes: Secondary | ICD-10-CM | POA: Diagnosis present

## 2014-07-28 DIAGNOSIS — Z886 Allergy status to analgesic agent status: Secondary | ICD-10-CM | POA: Diagnosis not present

## 2014-07-28 DIAGNOSIS — K219 Gastro-esophageal reflux disease without esophagitis: Secondary | ICD-10-CM | POA: Diagnosis not present

## 2014-07-28 DIAGNOSIS — M1990 Unspecified osteoarthritis, unspecified site: Secondary | ICD-10-CM | POA: Diagnosis present

## 2014-07-28 DIAGNOSIS — R5381 Other malaise: Secondary | ICD-10-CM | POA: Diagnosis present

## 2014-07-28 DIAGNOSIS — E039 Hypothyroidism, unspecified: Secondary | ICD-10-CM | POA: Diagnosis present

## 2014-07-28 DIAGNOSIS — Z950 Presence of cardiac pacemaker: Secondary | ICD-10-CM | POA: Diagnosis not present

## 2014-07-28 DIAGNOSIS — F419 Anxiety disorder, unspecified: Secondary | ICD-10-CM | POA: Diagnosis not present

## 2014-07-29 DIAGNOSIS — R7989 Other specified abnormal findings of blood chemistry: Secondary | ICD-10-CM | POA: Diagnosis not present

## 2014-07-29 DIAGNOSIS — R0600 Dyspnea, unspecified: Secondary | ICD-10-CM | POA: Diagnosis not present

## 2014-07-31 DIAGNOSIS — M6281 Muscle weakness (generalized): Secondary | ICD-10-CM | POA: Diagnosis not present

## 2014-07-31 DIAGNOSIS — R278 Other lack of coordination: Secondary | ICD-10-CM | POA: Diagnosis not present

## 2014-07-31 DIAGNOSIS — F329 Major depressive disorder, single episode, unspecified: Secondary | ICD-10-CM | POA: Diagnosis not present

## 2014-07-31 DIAGNOSIS — F419 Anxiety disorder, unspecified: Secondary | ICD-10-CM | POA: Diagnosis not present

## 2014-07-31 DIAGNOSIS — R0682 Tachypnea, not elsewhere classified: Secondary | ICD-10-CM | POA: Diagnosis not present

## 2014-07-31 DIAGNOSIS — I509 Heart failure, unspecified: Secondary | ICD-10-CM | POA: Diagnosis not present

## 2014-07-31 DIAGNOSIS — Z79899 Other long term (current) drug therapy: Secondary | ICD-10-CM | POA: Diagnosis not present

## 2014-07-31 DIAGNOSIS — I10 Essential (primary) hypertension: Secondary | ICD-10-CM | POA: Diagnosis present

## 2014-07-31 DIAGNOSIS — Z7982 Long term (current) use of aspirin: Secondary | ICD-10-CM | POA: Diagnosis not present

## 2014-07-31 DIAGNOSIS — Z950 Presence of cardiac pacemaker: Secondary | ICD-10-CM | POA: Diagnosis not present

## 2014-07-31 DIAGNOSIS — G2581 Restless legs syndrome: Secondary | ICD-10-CM | POA: Diagnosis present

## 2014-07-31 DIAGNOSIS — H1013 Acute atopic conjunctivitis, bilateral: Secondary | ICD-10-CM | POA: Diagnosis not present

## 2014-07-31 DIAGNOSIS — I341 Nonrheumatic mitral (valve) prolapse: Secondary | ICD-10-CM | POA: Diagnosis present

## 2014-07-31 DIAGNOSIS — R5381 Other malaise: Secondary | ICD-10-CM | POA: Diagnosis present

## 2014-07-31 DIAGNOSIS — Z885 Allergy status to narcotic agent status: Secondary | ICD-10-CM | POA: Diagnosis not present

## 2014-07-31 DIAGNOSIS — Z886 Allergy status to analgesic agent status: Secondary | ICD-10-CM | POA: Diagnosis not present

## 2014-07-31 DIAGNOSIS — M1990 Unspecified osteoarthritis, unspecified site: Secondary | ICD-10-CM | POA: Diagnosis present

## 2014-07-31 DIAGNOSIS — Z880 Allergy status to penicillin: Secondary | ICD-10-CM | POA: Diagnosis not present

## 2014-07-31 DIAGNOSIS — R7989 Other specified abnormal findings of blood chemistry: Secondary | ICD-10-CM | POA: Diagnosis not present

## 2014-07-31 DIAGNOSIS — K219 Gastro-esophageal reflux disease without esophagitis: Secondary | ICD-10-CM | POA: Diagnosis present

## 2014-07-31 DIAGNOSIS — F411 Generalized anxiety disorder: Secondary | ICD-10-CM | POA: Diagnosis present

## 2014-07-31 DIAGNOSIS — H409 Unspecified glaucoma: Secondary | ICD-10-CM | POA: Diagnosis present

## 2014-07-31 DIAGNOSIS — R509 Fever, unspecified: Secondary | ICD-10-CM | POA: Diagnosis not present

## 2014-07-31 DIAGNOSIS — R0600 Dyspnea, unspecified: Secondary | ICD-10-CM | POA: Diagnosis not present

## 2014-07-31 DIAGNOSIS — K5900 Constipation, unspecified: Secondary | ICD-10-CM | POA: Diagnosis not present

## 2014-07-31 DIAGNOSIS — I5032 Chronic diastolic (congestive) heart failure: Secondary | ICD-10-CM | POA: Diagnosis present

## 2014-07-31 DIAGNOSIS — I2510 Atherosclerotic heart disease of native coronary artery without angina pectoris: Secondary | ICD-10-CM | POA: Diagnosis present

## 2014-07-31 DIAGNOSIS — E785 Hyperlipidemia, unspecified: Secondary | ICD-10-CM | POA: Diagnosis present

## 2014-07-31 DIAGNOSIS — Z955 Presence of coronary angioplasty implant and graft: Secondary | ICD-10-CM | POA: Diagnosis not present

## 2014-07-31 DIAGNOSIS — I5033 Acute on chronic diastolic (congestive) heart failure: Secondary | ICD-10-CM | POA: Diagnosis not present

## 2014-07-31 DIAGNOSIS — R0602 Shortness of breath: Secondary | ICD-10-CM | POA: Diagnosis not present

## 2014-07-31 DIAGNOSIS — J441 Chronic obstructive pulmonary disease with (acute) exacerbation: Secondary | ICD-10-CM | POA: Diagnosis not present

## 2014-07-31 DIAGNOSIS — Z882 Allergy status to sulfonamides status: Secondary | ICD-10-CM | POA: Diagnosis not present

## 2014-07-31 DIAGNOSIS — J189 Pneumonia, unspecified organism: Secondary | ICD-10-CM | POA: Diagnosis not present

## 2014-07-31 DIAGNOSIS — J440 Chronic obstructive pulmonary disease with acute lower respiratory infection: Secondary | ICD-10-CM | POA: Diagnosis present

## 2014-08-02 DIAGNOSIS — I5033 Acute on chronic diastolic (congestive) heart failure: Secondary | ICD-10-CM | POA: Diagnosis not present

## 2014-08-08 DIAGNOSIS — I5033 Acute on chronic diastolic (congestive) heart failure: Secondary | ICD-10-CM | POA: Diagnosis not present

## 2014-08-17 DIAGNOSIS — H1013 Acute atopic conjunctivitis, bilateral: Secondary | ICD-10-CM | POA: Diagnosis not present

## 2014-08-20 DIAGNOSIS — K5900 Constipation, unspecified: Secondary | ICD-10-CM | POA: Diagnosis not present

## 2014-08-20 DIAGNOSIS — F329 Major depressive disorder, single episode, unspecified: Secondary | ICD-10-CM | POA: Diagnosis not present

## 2014-08-20 DIAGNOSIS — Z79899 Other long term (current) drug therapy: Secondary | ICD-10-CM | POA: Diagnosis not present

## 2014-08-20 DIAGNOSIS — Z885 Allergy status to narcotic agent status: Secondary | ICD-10-CM | POA: Diagnosis not present

## 2014-08-20 DIAGNOSIS — G2581 Restless legs syndrome: Secondary | ICD-10-CM | POA: Diagnosis present

## 2014-08-20 DIAGNOSIS — J181 Lobar pneumonia, unspecified organism: Secondary | ICD-10-CM | POA: Diagnosis not present

## 2014-08-20 DIAGNOSIS — R0682 Tachypnea, not elsewhere classified: Secondary | ICD-10-CM | POA: Diagnosis not present

## 2014-08-20 DIAGNOSIS — Z7982 Long term (current) use of aspirin: Secondary | ICD-10-CM | POA: Diagnosis not present

## 2014-08-20 DIAGNOSIS — F419 Anxiety disorder, unspecified: Secondary | ICD-10-CM | POA: Diagnosis not present

## 2014-08-20 DIAGNOSIS — J440 Chronic obstructive pulmonary disease with acute lower respiratory infection: Secondary | ICD-10-CM | POA: Diagnosis present

## 2014-08-20 DIAGNOSIS — J189 Pneumonia, unspecified organism: Secondary | ICD-10-CM | POA: Diagnosis not present

## 2014-08-20 DIAGNOSIS — I2510 Atherosclerotic heart disease of native coronary artery without angina pectoris: Secondary | ICD-10-CM | POA: Diagnosis present

## 2014-08-20 DIAGNOSIS — M6281 Muscle weakness (generalized): Secondary | ICD-10-CM | POA: Diagnosis not present

## 2014-08-20 DIAGNOSIS — I10 Essential (primary) hypertension: Secondary | ICD-10-CM | POA: Diagnosis not present

## 2014-08-20 DIAGNOSIS — R5381 Other malaise: Secondary | ICD-10-CM | POA: Diagnosis present

## 2014-08-20 DIAGNOSIS — R0602 Shortness of breath: Secondary | ICD-10-CM | POA: Diagnosis not present

## 2014-08-20 DIAGNOSIS — Z955 Presence of coronary angioplasty implant and graft: Secondary | ICD-10-CM | POA: Diagnosis not present

## 2014-08-20 DIAGNOSIS — I341 Nonrheumatic mitral (valve) prolapse: Secondary | ICD-10-CM | POA: Diagnosis present

## 2014-08-20 DIAGNOSIS — M1990 Unspecified osteoarthritis, unspecified site: Secondary | ICD-10-CM | POA: Diagnosis not present

## 2014-08-20 DIAGNOSIS — J90 Pleural effusion, not elsewhere classified: Secondary | ICD-10-CM | POA: Diagnosis not present

## 2014-08-20 DIAGNOSIS — H409 Unspecified glaucoma: Secondary | ICD-10-CM | POA: Diagnosis present

## 2014-08-20 DIAGNOSIS — E785 Hyperlipidemia, unspecified: Secondary | ICD-10-CM | POA: Diagnosis not present

## 2014-08-20 DIAGNOSIS — Z880 Allergy status to penicillin: Secondary | ICD-10-CM | POA: Diagnosis not present

## 2014-08-20 DIAGNOSIS — Z886 Allergy status to analgesic agent status: Secondary | ICD-10-CM | POA: Diagnosis not present

## 2014-08-20 DIAGNOSIS — K219 Gastro-esophageal reflux disease without esophagitis: Secondary | ICD-10-CM | POA: Diagnosis present

## 2014-08-20 DIAGNOSIS — J441 Chronic obstructive pulmonary disease with (acute) exacerbation: Secondary | ICD-10-CM | POA: Diagnosis not present

## 2014-08-20 DIAGNOSIS — Z950 Presence of cardiac pacemaker: Secondary | ICD-10-CM | POA: Diagnosis not present

## 2014-08-20 DIAGNOSIS — Z882 Allergy status to sulfonamides status: Secondary | ICD-10-CM | POA: Diagnosis not present

## 2014-08-20 DIAGNOSIS — F411 Generalized anxiety disorder: Secondary | ICD-10-CM | POA: Diagnosis present

## 2014-08-20 DIAGNOSIS — R509 Fever, unspecified: Secondary | ICD-10-CM | POA: Diagnosis not present

## 2014-08-20 DIAGNOSIS — I509 Heart failure, unspecified: Secondary | ICD-10-CM | POA: Diagnosis not present

## 2014-08-20 DIAGNOSIS — R278 Other lack of coordination: Secondary | ICD-10-CM | POA: Diagnosis not present

## 2014-08-20 DIAGNOSIS — I5032 Chronic diastolic (congestive) heart failure: Secondary | ICD-10-CM | POA: Diagnosis not present

## 2014-08-24 DIAGNOSIS — R6889 Other general symptoms and signs: Secondary | ICD-10-CM | POA: Diagnosis not present

## 2014-08-24 DIAGNOSIS — I10 Essential (primary) hypertension: Secondary | ICD-10-CM | POA: Diagnosis not present

## 2014-08-24 DIAGNOSIS — R278 Other lack of coordination: Secondary | ICD-10-CM | POA: Diagnosis not present

## 2014-08-24 DIAGNOSIS — K5900 Constipation, unspecified: Secondary | ICD-10-CM | POA: Diagnosis not present

## 2014-08-24 DIAGNOSIS — F329 Major depressive disorder, single episode, unspecified: Secondary | ICD-10-CM | POA: Diagnosis not present

## 2014-08-24 DIAGNOSIS — G2581 Restless legs syndrome: Secondary | ICD-10-CM | POA: Diagnosis not present

## 2014-08-24 DIAGNOSIS — H409 Unspecified glaucoma: Secondary | ICD-10-CM | POA: Diagnosis not present

## 2014-08-24 DIAGNOSIS — I509 Heart failure, unspecified: Secondary | ICD-10-CM | POA: Diagnosis not present

## 2014-08-24 DIAGNOSIS — K219 Gastro-esophageal reflux disease without esophagitis: Secondary | ICD-10-CM | POA: Diagnosis not present

## 2014-08-24 DIAGNOSIS — J441 Chronic obstructive pulmonary disease with (acute) exacerbation: Secondary | ICD-10-CM | POA: Diagnosis not present

## 2014-08-24 DIAGNOSIS — J189 Pneumonia, unspecified organism: Secondary | ICD-10-CM | POA: Diagnosis not present

## 2014-08-24 DIAGNOSIS — M6281 Muscle weakness (generalized): Secondary | ICD-10-CM | POA: Diagnosis not present

## 2014-08-24 DIAGNOSIS — F419 Anxiety disorder, unspecified: Secondary | ICD-10-CM | POA: Diagnosis not present

## 2014-08-24 DIAGNOSIS — E785 Hyperlipidemia, unspecified: Secondary | ICD-10-CM | POA: Diagnosis not present

## 2014-08-24 DIAGNOSIS — J440 Chronic obstructive pulmonary disease with acute lower respiratory infection: Secondary | ICD-10-CM | POA: Diagnosis not present

## 2014-08-27 DIAGNOSIS — J189 Pneumonia, unspecified organism: Secondary | ICD-10-CM | POA: Diagnosis not present

## 2014-08-27 DIAGNOSIS — J440 Chronic obstructive pulmonary disease with acute lower respiratory infection: Secondary | ICD-10-CM | POA: Diagnosis not present

## 2014-09-23 DIAGNOSIS — M6281 Muscle weakness (generalized): Secondary | ICD-10-CM | POA: Diagnosis not present

## 2014-09-23 DIAGNOSIS — J189 Pneumonia, unspecified organism: Secondary | ICD-10-CM | POA: Diagnosis not present

## 2014-09-23 DIAGNOSIS — I4891 Unspecified atrial fibrillation: Secondary | ICD-10-CM | POA: Diagnosis not present

## 2014-09-23 DIAGNOSIS — J440 Chronic obstructive pulmonary disease with acute lower respiratory infection: Secondary | ICD-10-CM | POA: Diagnosis not present

## 2014-09-23 DIAGNOSIS — J441 Chronic obstructive pulmonary disease with (acute) exacerbation: Secondary | ICD-10-CM | POA: Diagnosis not present

## 2014-09-23 DIAGNOSIS — I5032 Chronic diastolic (congestive) heart failure: Secondary | ICD-10-CM | POA: Diagnosis not present

## 2014-09-24 DIAGNOSIS — I4891 Unspecified atrial fibrillation: Secondary | ICD-10-CM | POA: Diagnosis not present

## 2014-09-24 DIAGNOSIS — J189 Pneumonia, unspecified organism: Secondary | ICD-10-CM | POA: Diagnosis not present

## 2014-09-24 DIAGNOSIS — J440 Chronic obstructive pulmonary disease with acute lower respiratory infection: Secondary | ICD-10-CM | POA: Diagnosis not present

## 2014-09-24 DIAGNOSIS — J441 Chronic obstructive pulmonary disease with (acute) exacerbation: Secondary | ICD-10-CM | POA: Diagnosis not present

## 2014-09-24 DIAGNOSIS — M6281 Muscle weakness (generalized): Secondary | ICD-10-CM | POA: Diagnosis not present

## 2014-09-24 DIAGNOSIS — I5032 Chronic diastolic (congestive) heart failure: Secondary | ICD-10-CM | POA: Diagnosis not present

## 2014-09-25 DIAGNOSIS — J189 Pneumonia, unspecified organism: Secondary | ICD-10-CM | POA: Diagnosis not present

## 2014-09-25 DIAGNOSIS — I4891 Unspecified atrial fibrillation: Secondary | ICD-10-CM | POA: Diagnosis not present

## 2014-09-25 DIAGNOSIS — J441 Chronic obstructive pulmonary disease with (acute) exacerbation: Secondary | ICD-10-CM | POA: Diagnosis not present

## 2014-09-25 DIAGNOSIS — I5032 Chronic diastolic (congestive) heart failure: Secondary | ICD-10-CM | POA: Diagnosis not present

## 2014-09-25 DIAGNOSIS — J440 Chronic obstructive pulmonary disease with acute lower respiratory infection: Secondary | ICD-10-CM | POA: Diagnosis not present

## 2014-09-25 DIAGNOSIS — M6281 Muscle weakness (generalized): Secondary | ICD-10-CM | POA: Diagnosis not present

## 2014-09-28 DIAGNOSIS — I4891 Unspecified atrial fibrillation: Secondary | ICD-10-CM | POA: Diagnosis not present

## 2014-09-28 DIAGNOSIS — J440 Chronic obstructive pulmonary disease with acute lower respiratory infection: Secondary | ICD-10-CM | POA: Diagnosis not present

## 2014-09-28 DIAGNOSIS — J189 Pneumonia, unspecified organism: Secondary | ICD-10-CM | POA: Diagnosis not present

## 2014-09-28 DIAGNOSIS — M6281 Muscle weakness (generalized): Secondary | ICD-10-CM | POA: Diagnosis not present

## 2014-09-28 DIAGNOSIS — J441 Chronic obstructive pulmonary disease with (acute) exacerbation: Secondary | ICD-10-CM | POA: Diagnosis not present

## 2014-09-28 DIAGNOSIS — I5032 Chronic diastolic (congestive) heart failure: Secondary | ICD-10-CM | POA: Diagnosis not present

## 2014-09-29 DIAGNOSIS — I5032 Chronic diastolic (congestive) heart failure: Secondary | ICD-10-CM | POA: Diagnosis not present

## 2014-09-29 DIAGNOSIS — M6281 Muscle weakness (generalized): Secondary | ICD-10-CM | POA: Diagnosis not present

## 2014-09-29 DIAGNOSIS — I4891 Unspecified atrial fibrillation: Secondary | ICD-10-CM | POA: Diagnosis not present

## 2014-09-29 DIAGNOSIS — J189 Pneumonia, unspecified organism: Secondary | ICD-10-CM | POA: Diagnosis not present

## 2014-09-29 DIAGNOSIS — J440 Chronic obstructive pulmonary disease with acute lower respiratory infection: Secondary | ICD-10-CM | POA: Diagnosis not present

## 2014-09-29 DIAGNOSIS — J441 Chronic obstructive pulmonary disease with (acute) exacerbation: Secondary | ICD-10-CM | POA: Diagnosis not present

## 2014-10-01 DIAGNOSIS — J441 Chronic obstructive pulmonary disease with (acute) exacerbation: Secondary | ICD-10-CM | POA: Diagnosis not present

## 2014-10-01 DIAGNOSIS — I4891 Unspecified atrial fibrillation: Secondary | ICD-10-CM | POA: Diagnosis not present

## 2014-10-01 DIAGNOSIS — I5032 Chronic diastolic (congestive) heart failure: Secondary | ICD-10-CM | POA: Diagnosis not present

## 2014-10-01 DIAGNOSIS — J440 Chronic obstructive pulmonary disease with acute lower respiratory infection: Secondary | ICD-10-CM | POA: Diagnosis not present

## 2014-10-01 DIAGNOSIS — M6281 Muscle weakness (generalized): Secondary | ICD-10-CM | POA: Diagnosis not present

## 2014-10-01 DIAGNOSIS — J189 Pneumonia, unspecified organism: Secondary | ICD-10-CM | POA: Diagnosis not present

## 2014-10-02 DIAGNOSIS — M419 Scoliosis, unspecified: Secondary | ICD-10-CM | POA: Diagnosis not present

## 2014-10-02 DIAGNOSIS — I509 Heart failure, unspecified: Secondary | ICD-10-CM | POA: Diagnosis not present

## 2014-10-02 DIAGNOSIS — E039 Hypothyroidism, unspecified: Secondary | ICD-10-CM | POA: Diagnosis not present

## 2014-10-02 DIAGNOSIS — G4700 Insomnia, unspecified: Secondary | ICD-10-CM | POA: Diagnosis not present

## 2014-10-02 DIAGNOSIS — J449 Chronic obstructive pulmonary disease, unspecified: Secondary | ICD-10-CM | POA: Diagnosis not present

## 2014-10-02 DIAGNOSIS — R5383 Other fatigue: Secondary | ICD-10-CM | POA: Diagnosis not present

## 2014-10-02 DIAGNOSIS — M545 Low back pain: Secondary | ICD-10-CM | POA: Diagnosis not present

## 2014-10-02 DIAGNOSIS — Z950 Presence of cardiac pacemaker: Secondary | ICD-10-CM | POA: Diagnosis not present

## 2014-10-09 DIAGNOSIS — Z9981 Dependence on supplemental oxygen: Secondary | ICD-10-CM | POA: Diagnosis not present

## 2014-10-09 DIAGNOSIS — J449 Chronic obstructive pulmonary disease, unspecified: Secondary | ICD-10-CM | POA: Diagnosis not present

## 2014-10-09 DIAGNOSIS — M6281 Muscle weakness (generalized): Secondary | ICD-10-CM | POA: Diagnosis not present

## 2014-10-09 DIAGNOSIS — I5030 Unspecified diastolic (congestive) heart failure: Secondary | ICD-10-CM | POA: Diagnosis not present

## 2014-10-09 DIAGNOSIS — I10 Essential (primary) hypertension: Secondary | ICD-10-CM | POA: Diagnosis not present

## 2014-10-09 DIAGNOSIS — M1990 Unspecified osteoarthritis, unspecified site: Secondary | ICD-10-CM | POA: Diagnosis not present

## 2014-10-09 DIAGNOSIS — F419 Anxiety disorder, unspecified: Secondary | ICD-10-CM | POA: Diagnosis not present

## 2014-10-09 DIAGNOSIS — J189 Pneumonia, unspecified organism: Secondary | ICD-10-CM | POA: Diagnosis not present

## 2014-10-13 DIAGNOSIS — M6281 Muscle weakness (generalized): Secondary | ICD-10-CM | POA: Diagnosis not present

## 2014-10-13 DIAGNOSIS — F419 Anxiety disorder, unspecified: Secondary | ICD-10-CM | POA: Diagnosis not present

## 2014-10-13 DIAGNOSIS — I10 Essential (primary) hypertension: Secondary | ICD-10-CM | POA: Diagnosis not present

## 2014-10-13 DIAGNOSIS — I5030 Unspecified diastolic (congestive) heart failure: Secondary | ICD-10-CM | POA: Diagnosis not present

## 2014-10-13 DIAGNOSIS — J189 Pneumonia, unspecified organism: Secondary | ICD-10-CM | POA: Diagnosis not present

## 2014-10-13 DIAGNOSIS — J449 Chronic obstructive pulmonary disease, unspecified: Secondary | ICD-10-CM | POA: Diagnosis not present

## 2014-10-14 DIAGNOSIS — I5030 Unspecified diastolic (congestive) heart failure: Secondary | ICD-10-CM | POA: Diagnosis not present

## 2014-10-14 DIAGNOSIS — M6281 Muscle weakness (generalized): Secondary | ICD-10-CM | POA: Diagnosis not present

## 2014-10-14 DIAGNOSIS — I10 Essential (primary) hypertension: Secondary | ICD-10-CM | POA: Diagnosis not present

## 2014-10-14 DIAGNOSIS — F419 Anxiety disorder, unspecified: Secondary | ICD-10-CM | POA: Diagnosis not present

## 2014-10-14 DIAGNOSIS — J449 Chronic obstructive pulmonary disease, unspecified: Secondary | ICD-10-CM | POA: Diagnosis not present

## 2014-10-14 DIAGNOSIS — J189 Pneumonia, unspecified organism: Secondary | ICD-10-CM | POA: Diagnosis not present

## 2014-10-15 DIAGNOSIS — I5030 Unspecified diastolic (congestive) heart failure: Secondary | ICD-10-CM | POA: Diagnosis not present

## 2014-10-15 DIAGNOSIS — J189 Pneumonia, unspecified organism: Secondary | ICD-10-CM | POA: Diagnosis not present

## 2014-10-15 DIAGNOSIS — F419 Anxiety disorder, unspecified: Secondary | ICD-10-CM | POA: Diagnosis not present

## 2014-10-15 DIAGNOSIS — M6281 Muscle weakness (generalized): Secondary | ICD-10-CM | POA: Diagnosis not present

## 2014-10-15 DIAGNOSIS — I10 Essential (primary) hypertension: Secondary | ICD-10-CM | POA: Diagnosis not present

## 2014-10-15 DIAGNOSIS — J449 Chronic obstructive pulmonary disease, unspecified: Secondary | ICD-10-CM | POA: Diagnosis not present

## 2014-10-16 DIAGNOSIS — J449 Chronic obstructive pulmonary disease, unspecified: Secondary | ICD-10-CM | POA: Diagnosis not present

## 2014-10-16 DIAGNOSIS — I5030 Unspecified diastolic (congestive) heart failure: Secondary | ICD-10-CM | POA: Diagnosis not present

## 2014-10-16 DIAGNOSIS — J189 Pneumonia, unspecified organism: Secondary | ICD-10-CM | POA: Diagnosis not present

## 2014-10-16 DIAGNOSIS — I10 Essential (primary) hypertension: Secondary | ICD-10-CM | POA: Diagnosis not present

## 2014-10-16 DIAGNOSIS — F419 Anxiety disorder, unspecified: Secondary | ICD-10-CM | POA: Diagnosis not present

## 2014-10-16 DIAGNOSIS — M6281 Muscle weakness (generalized): Secondary | ICD-10-CM | POA: Diagnosis not present

## 2014-10-19 DIAGNOSIS — I5030 Unspecified diastolic (congestive) heart failure: Secondary | ICD-10-CM | POA: Diagnosis not present

## 2014-10-19 DIAGNOSIS — F419 Anxiety disorder, unspecified: Secondary | ICD-10-CM | POA: Diagnosis not present

## 2014-10-19 DIAGNOSIS — J189 Pneumonia, unspecified organism: Secondary | ICD-10-CM | POA: Diagnosis not present

## 2014-10-19 DIAGNOSIS — M6281 Muscle weakness (generalized): Secondary | ICD-10-CM | POA: Diagnosis not present

## 2014-10-19 DIAGNOSIS — J449 Chronic obstructive pulmonary disease, unspecified: Secondary | ICD-10-CM | POA: Diagnosis not present

## 2014-10-19 DIAGNOSIS — I10 Essential (primary) hypertension: Secondary | ICD-10-CM | POA: Diagnosis not present

## 2014-10-20 DIAGNOSIS — M6281 Muscle weakness (generalized): Secondary | ICD-10-CM | POA: Diagnosis not present

## 2014-10-20 DIAGNOSIS — J449 Chronic obstructive pulmonary disease, unspecified: Secondary | ICD-10-CM | POA: Diagnosis not present

## 2014-10-20 DIAGNOSIS — I10 Essential (primary) hypertension: Secondary | ICD-10-CM | POA: Diagnosis not present

## 2014-10-20 DIAGNOSIS — I5030 Unspecified diastolic (congestive) heart failure: Secondary | ICD-10-CM | POA: Diagnosis not present

## 2014-10-20 DIAGNOSIS — F419 Anxiety disorder, unspecified: Secondary | ICD-10-CM | POA: Diagnosis not present

## 2014-10-20 DIAGNOSIS — J189 Pneumonia, unspecified organism: Secondary | ICD-10-CM | POA: Diagnosis not present

## 2014-10-21 DIAGNOSIS — M6281 Muscle weakness (generalized): Secondary | ICD-10-CM | POA: Diagnosis not present

## 2014-10-21 DIAGNOSIS — J189 Pneumonia, unspecified organism: Secondary | ICD-10-CM | POA: Diagnosis not present

## 2014-10-21 DIAGNOSIS — F419 Anxiety disorder, unspecified: Secondary | ICD-10-CM | POA: Diagnosis not present

## 2014-10-21 DIAGNOSIS — I10 Essential (primary) hypertension: Secondary | ICD-10-CM | POA: Diagnosis not present

## 2014-10-21 DIAGNOSIS — I5030 Unspecified diastolic (congestive) heart failure: Secondary | ICD-10-CM | POA: Diagnosis not present

## 2014-10-21 DIAGNOSIS — J449 Chronic obstructive pulmonary disease, unspecified: Secondary | ICD-10-CM | POA: Diagnosis not present

## 2014-10-22 DIAGNOSIS — I10 Essential (primary) hypertension: Secondary | ICD-10-CM | POA: Diagnosis not present

## 2014-10-22 DIAGNOSIS — I5030 Unspecified diastolic (congestive) heart failure: Secondary | ICD-10-CM | POA: Diagnosis not present

## 2014-10-22 DIAGNOSIS — Z23 Encounter for immunization: Secondary | ICD-10-CM | POA: Diagnosis not present

## 2014-10-22 DIAGNOSIS — M6281 Muscle weakness (generalized): Secondary | ICD-10-CM | POA: Diagnosis not present

## 2014-10-22 DIAGNOSIS — J189 Pneumonia, unspecified organism: Secondary | ICD-10-CM | POA: Diagnosis not present

## 2014-10-22 DIAGNOSIS — F419 Anxiety disorder, unspecified: Secondary | ICD-10-CM | POA: Diagnosis not present

## 2014-10-22 DIAGNOSIS — J449 Chronic obstructive pulmonary disease, unspecified: Secondary | ICD-10-CM | POA: Diagnosis not present

## 2014-10-23 DIAGNOSIS — J189 Pneumonia, unspecified organism: Secondary | ICD-10-CM | POA: Diagnosis not present

## 2014-10-23 DIAGNOSIS — J449 Chronic obstructive pulmonary disease, unspecified: Secondary | ICD-10-CM | POA: Diagnosis not present

## 2014-10-23 DIAGNOSIS — I5030 Unspecified diastolic (congestive) heart failure: Secondary | ICD-10-CM | POA: Diagnosis not present

## 2014-10-23 DIAGNOSIS — M6281 Muscle weakness (generalized): Secondary | ICD-10-CM | POA: Diagnosis not present

## 2014-10-23 DIAGNOSIS — I10 Essential (primary) hypertension: Secondary | ICD-10-CM | POA: Diagnosis not present

## 2014-10-23 DIAGNOSIS — F419 Anxiety disorder, unspecified: Secondary | ICD-10-CM | POA: Diagnosis not present

## 2014-10-26 DIAGNOSIS — J449 Chronic obstructive pulmonary disease, unspecified: Secondary | ICD-10-CM | POA: Diagnosis not present

## 2014-10-26 DIAGNOSIS — J189 Pneumonia, unspecified organism: Secondary | ICD-10-CM | POA: Diagnosis not present

## 2014-10-26 DIAGNOSIS — M6281 Muscle weakness (generalized): Secondary | ICD-10-CM | POA: Diagnosis not present

## 2014-10-26 DIAGNOSIS — I10 Essential (primary) hypertension: Secondary | ICD-10-CM | POA: Diagnosis not present

## 2014-10-26 DIAGNOSIS — I5030 Unspecified diastolic (congestive) heart failure: Secondary | ICD-10-CM | POA: Diagnosis not present

## 2014-10-26 DIAGNOSIS — F419 Anxiety disorder, unspecified: Secondary | ICD-10-CM | POA: Diagnosis not present

## 2014-10-28 DIAGNOSIS — J449 Chronic obstructive pulmonary disease, unspecified: Secondary | ICD-10-CM | POA: Diagnosis not present

## 2014-10-28 DIAGNOSIS — I5030 Unspecified diastolic (congestive) heart failure: Secondary | ICD-10-CM | POA: Diagnosis not present

## 2014-10-28 DIAGNOSIS — J189 Pneumonia, unspecified organism: Secondary | ICD-10-CM | POA: Diagnosis not present

## 2014-10-28 DIAGNOSIS — F419 Anxiety disorder, unspecified: Secondary | ICD-10-CM | POA: Diagnosis not present

## 2014-10-28 DIAGNOSIS — M6281 Muscle weakness (generalized): Secondary | ICD-10-CM | POA: Diagnosis not present

## 2014-10-28 DIAGNOSIS — I10 Essential (primary) hypertension: Secondary | ICD-10-CM | POA: Diagnosis not present

## 2014-10-28 DIAGNOSIS — I509 Heart failure, unspecified: Secondary | ICD-10-CM | POA: Diagnosis not present

## 2014-10-28 DIAGNOSIS — E039 Hypothyroidism, unspecified: Secondary | ICD-10-CM | POA: Diagnosis not present

## 2014-10-29 DIAGNOSIS — I5030 Unspecified diastolic (congestive) heart failure: Secondary | ICD-10-CM | POA: Diagnosis not present

## 2014-10-29 DIAGNOSIS — M6281 Muscle weakness (generalized): Secondary | ICD-10-CM | POA: Diagnosis not present

## 2014-10-29 DIAGNOSIS — I10 Essential (primary) hypertension: Secondary | ICD-10-CM | POA: Diagnosis not present

## 2014-10-29 DIAGNOSIS — J449 Chronic obstructive pulmonary disease, unspecified: Secondary | ICD-10-CM | POA: Diagnosis not present

## 2014-10-29 DIAGNOSIS — F419 Anxiety disorder, unspecified: Secondary | ICD-10-CM | POA: Diagnosis not present

## 2014-10-29 DIAGNOSIS — J189 Pneumonia, unspecified organism: Secondary | ICD-10-CM | POA: Diagnosis not present

## 2014-10-30 DIAGNOSIS — F419 Anxiety disorder, unspecified: Secondary | ICD-10-CM | POA: Diagnosis not present

## 2014-10-30 DIAGNOSIS — I10 Essential (primary) hypertension: Secondary | ICD-10-CM | POA: Diagnosis not present

## 2014-10-30 DIAGNOSIS — J449 Chronic obstructive pulmonary disease, unspecified: Secondary | ICD-10-CM | POA: Diagnosis not present

## 2014-10-30 DIAGNOSIS — J189 Pneumonia, unspecified organism: Secondary | ICD-10-CM | POA: Diagnosis not present

## 2014-10-30 DIAGNOSIS — I5030 Unspecified diastolic (congestive) heart failure: Secondary | ICD-10-CM | POA: Diagnosis not present

## 2014-10-30 DIAGNOSIS — M6281 Muscle weakness (generalized): Secondary | ICD-10-CM | POA: Diagnosis not present

## 2014-11-04 DIAGNOSIS — J189 Pneumonia, unspecified organism: Secondary | ICD-10-CM | POA: Diagnosis not present

## 2014-11-04 DIAGNOSIS — I5030 Unspecified diastolic (congestive) heart failure: Secondary | ICD-10-CM | POA: Diagnosis not present

## 2014-11-04 DIAGNOSIS — J449 Chronic obstructive pulmonary disease, unspecified: Secondary | ICD-10-CM | POA: Diagnosis not present

## 2014-11-04 DIAGNOSIS — M6281 Muscle weakness (generalized): Secondary | ICD-10-CM | POA: Diagnosis not present

## 2014-11-04 DIAGNOSIS — F419 Anxiety disorder, unspecified: Secondary | ICD-10-CM | POA: Diagnosis not present

## 2014-11-04 DIAGNOSIS — I10 Essential (primary) hypertension: Secondary | ICD-10-CM | POA: Diagnosis not present

## 2014-11-06 DIAGNOSIS — F419 Anxiety disorder, unspecified: Secondary | ICD-10-CM | POA: Diagnosis not present

## 2014-11-06 DIAGNOSIS — I5030 Unspecified diastolic (congestive) heart failure: Secondary | ICD-10-CM | POA: Diagnosis not present

## 2014-11-06 DIAGNOSIS — J449 Chronic obstructive pulmonary disease, unspecified: Secondary | ICD-10-CM | POA: Diagnosis not present

## 2014-11-06 DIAGNOSIS — I10 Essential (primary) hypertension: Secondary | ICD-10-CM | POA: Diagnosis not present

## 2014-11-06 DIAGNOSIS — M6281 Muscle weakness (generalized): Secondary | ICD-10-CM | POA: Diagnosis not present

## 2014-11-06 DIAGNOSIS — J189 Pneumonia, unspecified organism: Secondary | ICD-10-CM | POA: Diagnosis not present

## 2014-11-10 DIAGNOSIS — I10 Essential (primary) hypertension: Secondary | ICD-10-CM | POA: Diagnosis not present

## 2014-11-10 DIAGNOSIS — F419 Anxiety disorder, unspecified: Secondary | ICD-10-CM | POA: Diagnosis not present

## 2014-11-10 DIAGNOSIS — M6281 Muscle weakness (generalized): Secondary | ICD-10-CM | POA: Diagnosis not present

## 2014-11-10 DIAGNOSIS — J189 Pneumonia, unspecified organism: Secondary | ICD-10-CM | POA: Diagnosis not present

## 2014-11-10 DIAGNOSIS — I5030 Unspecified diastolic (congestive) heart failure: Secondary | ICD-10-CM | POA: Diagnosis not present

## 2014-11-10 DIAGNOSIS — J449 Chronic obstructive pulmonary disease, unspecified: Secondary | ICD-10-CM | POA: Diagnosis not present

## 2014-11-13 DIAGNOSIS — I5030 Unspecified diastolic (congestive) heart failure: Secondary | ICD-10-CM | POA: Diagnosis not present

## 2014-11-13 DIAGNOSIS — F419 Anxiety disorder, unspecified: Secondary | ICD-10-CM | POA: Diagnosis not present

## 2014-11-13 DIAGNOSIS — I10 Essential (primary) hypertension: Secondary | ICD-10-CM | POA: Diagnosis not present

## 2014-11-13 DIAGNOSIS — M6281 Muscle weakness (generalized): Secondary | ICD-10-CM | POA: Diagnosis not present

## 2014-11-13 DIAGNOSIS — J189 Pneumonia, unspecified organism: Secondary | ICD-10-CM | POA: Diagnosis not present

## 2014-11-13 DIAGNOSIS — J449 Chronic obstructive pulmonary disease, unspecified: Secondary | ICD-10-CM | POA: Diagnosis not present

## 2014-11-16 DIAGNOSIS — H401110 Primary open-angle glaucoma, right eye, stage unspecified: Secondary | ICD-10-CM | POA: Diagnosis not present

## 2014-11-16 DIAGNOSIS — Z961 Presence of intraocular lens: Secondary | ICD-10-CM | POA: Diagnosis not present

## 2014-11-17 DIAGNOSIS — J449 Chronic obstructive pulmonary disease, unspecified: Secondary | ICD-10-CM | POA: Diagnosis not present

## 2014-11-17 DIAGNOSIS — I5030 Unspecified diastolic (congestive) heart failure: Secondary | ICD-10-CM | POA: Diagnosis not present

## 2014-11-17 DIAGNOSIS — M6281 Muscle weakness (generalized): Secondary | ICD-10-CM | POA: Diagnosis not present

## 2014-11-17 DIAGNOSIS — J189 Pneumonia, unspecified organism: Secondary | ICD-10-CM | POA: Diagnosis not present

## 2014-11-17 DIAGNOSIS — I10 Essential (primary) hypertension: Secondary | ICD-10-CM | POA: Diagnosis not present

## 2014-11-17 DIAGNOSIS — F419 Anxiety disorder, unspecified: Secondary | ICD-10-CM | POA: Diagnosis not present

## 2014-11-19 DIAGNOSIS — Z85828 Personal history of other malignant neoplasm of skin: Secondary | ICD-10-CM | POA: Diagnosis not present

## 2014-11-19 DIAGNOSIS — L570 Actinic keratosis: Secondary | ICD-10-CM | POA: Diagnosis not present

## 2014-11-24 DIAGNOSIS — M6281 Muscle weakness (generalized): Secondary | ICD-10-CM | POA: Diagnosis not present

## 2014-11-24 DIAGNOSIS — J449 Chronic obstructive pulmonary disease, unspecified: Secondary | ICD-10-CM | POA: Diagnosis not present

## 2014-11-24 DIAGNOSIS — I5030 Unspecified diastolic (congestive) heart failure: Secondary | ICD-10-CM | POA: Diagnosis not present

## 2014-11-24 DIAGNOSIS — J189 Pneumonia, unspecified organism: Secondary | ICD-10-CM | POA: Diagnosis not present

## 2014-11-24 DIAGNOSIS — I10 Essential (primary) hypertension: Secondary | ICD-10-CM | POA: Diagnosis not present

## 2014-11-24 DIAGNOSIS — F419 Anxiety disorder, unspecified: Secondary | ICD-10-CM | POA: Diagnosis not present

## 2014-12-02 DIAGNOSIS — I5030 Unspecified diastolic (congestive) heart failure: Secondary | ICD-10-CM | POA: Diagnosis not present

## 2014-12-02 DIAGNOSIS — J449 Chronic obstructive pulmonary disease, unspecified: Secondary | ICD-10-CM | POA: Diagnosis not present

## 2014-12-02 DIAGNOSIS — J189 Pneumonia, unspecified organism: Secondary | ICD-10-CM | POA: Diagnosis not present

## 2014-12-02 DIAGNOSIS — I10 Essential (primary) hypertension: Secondary | ICD-10-CM | POA: Diagnosis not present

## 2014-12-02 DIAGNOSIS — F419 Anxiety disorder, unspecified: Secondary | ICD-10-CM | POA: Diagnosis not present

## 2014-12-02 DIAGNOSIS — M6281 Muscle weakness (generalized): Secondary | ICD-10-CM | POA: Diagnosis not present

## 2014-12-15 DIAGNOSIS — K5900 Constipation, unspecified: Secondary | ICD-10-CM | POA: Diagnosis not present

## 2014-12-15 DIAGNOSIS — K5901 Slow transit constipation: Secondary | ICD-10-CM | POA: Diagnosis not present

## 2014-12-15 DIAGNOSIS — R3915 Urgency of urination: Secondary | ICD-10-CM | POA: Diagnosis not present

## 2014-12-15 DIAGNOSIS — N905 Atrophy of vulva: Secondary | ICD-10-CM | POA: Diagnosis not present

## 2014-12-22 DIAGNOSIS — J449 Chronic obstructive pulmonary disease, unspecified: Secondary | ICD-10-CM | POA: Diagnosis not present

## 2014-12-22 DIAGNOSIS — N189 Chronic kidney disease, unspecified: Secondary | ICD-10-CM | POA: Diagnosis not present

## 2014-12-22 DIAGNOSIS — I10 Essential (primary) hypertension: Secondary | ICD-10-CM | POA: Diagnosis not present

## 2014-12-22 DIAGNOSIS — I509 Heart failure, unspecified: Secondary | ICD-10-CM | POA: Diagnosis not present

## 2014-12-25 DIAGNOSIS — Z950 Presence of cardiac pacemaker: Secondary | ICD-10-CM | POA: Diagnosis not present

## 2014-12-25 DIAGNOSIS — I442 Atrioventricular block, complete: Secondary | ICD-10-CM | POA: Diagnosis not present

## 2015-01-18 DIAGNOSIS — E039 Hypothyroidism, unspecified: Secondary | ICD-10-CM | POA: Diagnosis not present

## 2015-01-18 DIAGNOSIS — J449 Chronic obstructive pulmonary disease, unspecified: Secondary | ICD-10-CM | POA: Diagnosis not present

## 2015-01-18 DIAGNOSIS — I509 Heart failure, unspecified: Secondary | ICD-10-CM | POA: Diagnosis not present

## 2015-01-18 DIAGNOSIS — E7800 Pure hypercholesterolemia, unspecified: Secondary | ICD-10-CM | POA: Diagnosis not present

## 2015-01-18 DIAGNOSIS — E559 Vitamin D deficiency, unspecified: Secondary | ICD-10-CM | POA: Diagnosis not present

## 2015-01-18 DIAGNOSIS — I10 Essential (primary) hypertension: Secondary | ICD-10-CM | POA: Diagnosis not present

## 2015-01-18 DIAGNOSIS — R5383 Other fatigue: Secondary | ICD-10-CM | POA: Diagnosis not present

## 2015-01-21 DIAGNOSIS — I10 Essential (primary) hypertension: Secondary | ICD-10-CM | POA: Diagnosis not present

## 2015-01-21 DIAGNOSIS — E559 Vitamin D deficiency, unspecified: Secondary | ICD-10-CM | POA: Diagnosis not present

## 2015-01-21 DIAGNOSIS — I509 Heart failure, unspecified: Secondary | ICD-10-CM | POA: Diagnosis not present

## 2015-01-21 DIAGNOSIS — Z1389 Encounter for screening for other disorder: Secondary | ICD-10-CM | POA: Diagnosis not present

## 2015-01-21 DIAGNOSIS — J449 Chronic obstructive pulmonary disease, unspecified: Secondary | ICD-10-CM | POA: Diagnosis not present

## 2015-01-21 DIAGNOSIS — E039 Hypothyroidism, unspecified: Secondary | ICD-10-CM | POA: Diagnosis not present

## 2015-02-16 DIAGNOSIS — Z888 Allergy status to other drugs, medicaments and biological substances status: Secondary | ICD-10-CM | POA: Diagnosis not present

## 2015-02-16 DIAGNOSIS — K219 Gastro-esophageal reflux disease without esophagitis: Secondary | ICD-10-CM | POA: Diagnosis not present

## 2015-02-16 DIAGNOSIS — E7800 Pure hypercholesterolemia, unspecified: Secondary | ICD-10-CM | POA: Diagnosis not present

## 2015-02-16 DIAGNOSIS — J449 Chronic obstructive pulmonary disease, unspecified: Secondary | ICD-10-CM | POA: Diagnosis not present

## 2015-02-16 DIAGNOSIS — I10 Essential (primary) hypertension: Secondary | ICD-10-CM | POA: Diagnosis not present

## 2015-02-16 DIAGNOSIS — Z885 Allergy status to narcotic agent status: Secondary | ICD-10-CM | POA: Diagnosis not present

## 2015-02-16 DIAGNOSIS — I2510 Atherosclerotic heart disease of native coronary artery without angina pectoris: Secondary | ICD-10-CM | POA: Diagnosis not present

## 2015-02-16 DIAGNOSIS — Z7951 Long term (current) use of inhaled steroids: Secondary | ICD-10-CM | POA: Diagnosis not present

## 2015-02-16 DIAGNOSIS — Z79899 Other long term (current) drug therapy: Secondary | ICD-10-CM | POA: Diagnosis not present

## 2015-02-16 DIAGNOSIS — Z882 Allergy status to sulfonamides status: Secondary | ICD-10-CM | POA: Diagnosis not present

## 2015-02-16 DIAGNOSIS — Z950 Presence of cardiac pacemaker: Secondary | ICD-10-CM | POA: Diagnosis not present

## 2015-02-16 DIAGNOSIS — Z880 Allergy status to penicillin: Secondary | ICD-10-CM | POA: Diagnosis not present

## 2015-02-16 DIAGNOSIS — Z7982 Long term (current) use of aspirin: Secondary | ICD-10-CM | POA: Diagnosis not present

## 2015-02-22 DIAGNOSIS — H401193 Primary open-angle glaucoma, unspecified eye, severe stage: Secondary | ICD-10-CM | POA: Diagnosis not present

## 2015-02-22 DIAGNOSIS — H401133 Primary open-angle glaucoma, bilateral, severe stage: Secondary | ICD-10-CM | POA: Diagnosis not present

## 2015-02-23 DIAGNOSIS — H401110 Primary open-angle glaucoma, right eye, stage unspecified: Secondary | ICD-10-CM | POA: Diagnosis not present

## 2015-03-04 DIAGNOSIS — N3642 Intrinsic sphincter deficiency (ISD): Secondary | ICD-10-CM | POA: Diagnosis not present

## 2015-03-04 DIAGNOSIS — N3281 Overactive bladder: Secondary | ICD-10-CM | POA: Diagnosis not present

## 2015-03-24 DIAGNOSIS — I10 Essential (primary) hypertension: Secondary | ICD-10-CM | POA: Diagnosis not present

## 2015-03-24 DIAGNOSIS — K210 Gastro-esophageal reflux disease with esophagitis: Secondary | ICD-10-CM | POA: Diagnosis not present

## 2015-03-24 DIAGNOSIS — J309 Allergic rhinitis, unspecified: Secondary | ICD-10-CM | POA: Diagnosis not present

## 2015-03-24 DIAGNOSIS — J449 Chronic obstructive pulmonary disease, unspecified: Secondary | ICD-10-CM | POA: Diagnosis not present

## 2015-03-25 DIAGNOSIS — I442 Atrioventricular block, complete: Secondary | ICD-10-CM | POA: Diagnosis not present

## 2015-03-25 DIAGNOSIS — Z950 Presence of cardiac pacemaker: Secondary | ICD-10-CM | POA: Diagnosis not present

## 2015-04-21 DIAGNOSIS — L570 Actinic keratosis: Secondary | ICD-10-CM | POA: Diagnosis not present

## 2015-04-21 DIAGNOSIS — Z85828 Personal history of other malignant neoplasm of skin: Secondary | ICD-10-CM | POA: Diagnosis not present

## 2015-04-21 DIAGNOSIS — D1800 Hemangioma unspecified site: Secondary | ICD-10-CM | POA: Diagnosis not present

## 2015-05-10 DIAGNOSIS — J449 Chronic obstructive pulmonary disease, unspecified: Secondary | ICD-10-CM | POA: Diagnosis not present

## 2015-05-10 DIAGNOSIS — G2581 Restless legs syndrome: Secondary | ICD-10-CM | POA: Diagnosis not present

## 2015-05-10 DIAGNOSIS — R410 Disorientation, unspecified: Secondary | ICD-10-CM | POA: Diagnosis not present

## 2015-05-13 DIAGNOSIS — R1084 Generalized abdominal pain: Secondary | ICD-10-CM | POA: Diagnosis not present

## 2015-05-13 DIAGNOSIS — Z950 Presence of cardiac pacemaker: Secondary | ICD-10-CM | POA: Diagnosis not present

## 2015-05-13 DIAGNOSIS — N179 Acute kidney failure, unspecified: Secondary | ICD-10-CM | POA: Diagnosis not present

## 2015-05-13 DIAGNOSIS — I5032 Chronic diastolic (congestive) heart failure: Secondary | ICD-10-CM | POA: Diagnosis not present

## 2015-05-13 DIAGNOSIS — I110 Hypertensive heart disease with heart failure: Secondary | ICD-10-CM | POA: Diagnosis not present

## 2015-05-13 DIAGNOSIS — R630 Anorexia: Secondary | ICD-10-CM | POA: Diagnosis not present

## 2015-05-13 DIAGNOSIS — D649 Anemia, unspecified: Secondary | ICD-10-CM | POA: Diagnosis not present

## 2015-05-13 DIAGNOSIS — E860 Dehydration: Secondary | ICD-10-CM | POA: Diagnosis not present

## 2015-05-13 DIAGNOSIS — R110 Nausea: Secondary | ICD-10-CM | POA: Diagnosis not present

## 2015-05-14 DIAGNOSIS — Z886 Allergy status to analgesic agent status: Secondary | ICD-10-CM | POA: Diagnosis not present

## 2015-05-14 DIAGNOSIS — I110 Hypertensive heart disease with heart failure: Secondary | ICD-10-CM | POA: Diagnosis present

## 2015-05-14 DIAGNOSIS — F0390 Unspecified dementia without behavioral disturbance: Secondary | ICD-10-CM | POA: Diagnosis present

## 2015-05-14 DIAGNOSIS — Z7982 Long term (current) use of aspirin: Secondary | ICD-10-CM | POA: Diagnosis not present

## 2015-05-14 DIAGNOSIS — E860 Dehydration: Secondary | ICD-10-CM | POA: Diagnosis present

## 2015-05-14 DIAGNOSIS — R0602 Shortness of breath: Secondary | ICD-10-CM | POA: Diagnosis not present

## 2015-05-14 DIAGNOSIS — H409 Unspecified glaucoma: Secondary | ICD-10-CM | POA: Diagnosis present

## 2015-05-14 DIAGNOSIS — E785 Hyperlipidemia, unspecified: Secondary | ICD-10-CM | POA: Diagnosis present

## 2015-05-14 DIAGNOSIS — M1990 Unspecified osteoarthritis, unspecified site: Secondary | ICD-10-CM | POA: Diagnosis present

## 2015-05-14 DIAGNOSIS — Z950 Presence of cardiac pacemaker: Secondary | ICD-10-CM | POA: Diagnosis not present

## 2015-05-14 DIAGNOSIS — Z880 Allergy status to penicillin: Secondary | ICD-10-CM | POA: Diagnosis not present

## 2015-05-14 DIAGNOSIS — I2510 Atherosclerotic heart disease of native coronary artery without angina pectoris: Secondary | ICD-10-CM | POA: Diagnosis present

## 2015-05-14 DIAGNOSIS — Z955 Presence of coronary angioplasty implant and graft: Secondary | ICD-10-CM | POA: Diagnosis not present

## 2015-05-14 DIAGNOSIS — J449 Chronic obstructive pulmonary disease, unspecified: Secondary | ICD-10-CM | POA: Diagnosis present

## 2015-05-14 DIAGNOSIS — G2581 Restless legs syndrome: Secondary | ICD-10-CM | POA: Diagnosis present

## 2015-05-14 DIAGNOSIS — I341 Nonrheumatic mitral (valve) prolapse: Secondary | ICD-10-CM | POA: Diagnosis present

## 2015-05-14 DIAGNOSIS — R630 Anorexia: Secondary | ICD-10-CM | POA: Diagnosis present

## 2015-05-14 DIAGNOSIS — I5032 Chronic diastolic (congestive) heart failure: Secondary | ICD-10-CM | POA: Diagnosis present

## 2015-05-14 DIAGNOSIS — N179 Acute kidney failure, unspecified: Secondary | ICD-10-CM | POA: Diagnosis present

## 2015-05-14 DIAGNOSIS — K219 Gastro-esophageal reflux disease without esophagitis: Secondary | ICD-10-CM | POA: Diagnosis present

## 2015-05-14 DIAGNOSIS — I252 Old myocardial infarction: Secondary | ICD-10-CM | POA: Diagnosis not present

## 2015-05-14 DIAGNOSIS — Z882 Allergy status to sulfonamides status: Secondary | ICD-10-CM | POA: Diagnosis not present

## 2015-05-14 DIAGNOSIS — Z6820 Body mass index (BMI) 20.0-20.9, adult: Secondary | ICD-10-CM | POA: Diagnosis not present

## 2015-05-14 DIAGNOSIS — Z888 Allergy status to other drugs, medicaments and biological substances status: Secondary | ICD-10-CM | POA: Diagnosis not present

## 2015-05-14 DIAGNOSIS — D649 Anemia, unspecified: Secondary | ICD-10-CM | POA: Diagnosis present

## 2015-05-14 DIAGNOSIS — R1310 Dysphagia, unspecified: Secondary | ICD-10-CM | POA: Diagnosis not present

## 2015-05-14 DIAGNOSIS — E039 Hypothyroidism, unspecified: Secondary | ICD-10-CM | POA: Diagnosis present

## 2015-05-21 DIAGNOSIS — D509 Iron deficiency anemia, unspecified: Secondary | ICD-10-CM | POA: Diagnosis not present

## 2015-05-21 DIAGNOSIS — M159 Polyosteoarthritis, unspecified: Secondary | ICD-10-CM | POA: Diagnosis not present

## 2015-05-21 DIAGNOSIS — I252 Old myocardial infarction: Secondary | ICD-10-CM | POA: Diagnosis not present

## 2015-05-21 DIAGNOSIS — G2581 Restless legs syndrome: Secondary | ICD-10-CM | POA: Diagnosis not present

## 2015-05-21 DIAGNOSIS — I2510 Atherosclerotic heart disease of native coronary artery without angina pectoris: Secondary | ICD-10-CM | POA: Diagnosis not present

## 2015-05-21 DIAGNOSIS — J449 Chronic obstructive pulmonary disease, unspecified: Secondary | ICD-10-CM | POA: Diagnosis not present

## 2015-05-21 DIAGNOSIS — I5032 Chronic diastolic (congestive) heart failure: Secondary | ICD-10-CM | POA: Diagnosis not present

## 2015-05-21 DIAGNOSIS — E039 Hypothyroidism, unspecified: Secondary | ICD-10-CM | POA: Diagnosis not present

## 2015-05-21 DIAGNOSIS — E785 Hyperlipidemia, unspecified: Secondary | ICD-10-CM | POA: Diagnosis not present

## 2015-05-21 DIAGNOSIS — Z9981 Dependence on supplemental oxygen: Secondary | ICD-10-CM | POA: Diagnosis not present

## 2015-05-24 DIAGNOSIS — M159 Polyosteoarthritis, unspecified: Secondary | ICD-10-CM | POA: Diagnosis not present

## 2015-05-24 DIAGNOSIS — I5032 Chronic diastolic (congestive) heart failure: Secondary | ICD-10-CM | POA: Diagnosis not present

## 2015-05-24 DIAGNOSIS — G2581 Restless legs syndrome: Secondary | ICD-10-CM | POA: Diagnosis not present

## 2015-05-24 DIAGNOSIS — D509 Iron deficiency anemia, unspecified: Secondary | ICD-10-CM | POA: Diagnosis not present

## 2015-05-24 DIAGNOSIS — J449 Chronic obstructive pulmonary disease, unspecified: Secondary | ICD-10-CM | POA: Diagnosis not present

## 2015-05-24 DIAGNOSIS — I2510 Atherosclerotic heart disease of native coronary artery without angina pectoris: Secondary | ICD-10-CM | POA: Diagnosis not present

## 2015-05-25 DIAGNOSIS — I2510 Atherosclerotic heart disease of native coronary artery without angina pectoris: Secondary | ICD-10-CM | POA: Diagnosis not present

## 2015-05-25 DIAGNOSIS — I5032 Chronic diastolic (congestive) heart failure: Secondary | ICD-10-CM | POA: Diagnosis not present

## 2015-05-25 DIAGNOSIS — J449 Chronic obstructive pulmonary disease, unspecified: Secondary | ICD-10-CM | POA: Diagnosis not present

## 2015-05-25 DIAGNOSIS — M159 Polyosteoarthritis, unspecified: Secondary | ICD-10-CM | POA: Diagnosis not present

## 2015-05-25 DIAGNOSIS — G2581 Restless legs syndrome: Secondary | ICD-10-CM | POA: Diagnosis not present

## 2015-05-25 DIAGNOSIS — D509 Iron deficiency anemia, unspecified: Secondary | ICD-10-CM | POA: Diagnosis not present

## 2015-05-25 DIAGNOSIS — Z950 Presence of cardiac pacemaker: Secondary | ICD-10-CM | POA: Diagnosis not present

## 2015-05-25 DIAGNOSIS — E039 Hypothyroidism, unspecified: Secondary | ICD-10-CM | POA: Diagnosis not present

## 2015-05-25 DIAGNOSIS — I509 Heart failure, unspecified: Secondary | ICD-10-CM | POA: Diagnosis not present

## 2015-05-25 DIAGNOSIS — R410 Disorientation, unspecified: Secondary | ICD-10-CM | POA: Diagnosis not present

## 2015-05-26 DIAGNOSIS — I5032 Chronic diastolic (congestive) heart failure: Secondary | ICD-10-CM | POA: Diagnosis not present

## 2015-05-26 DIAGNOSIS — J449 Chronic obstructive pulmonary disease, unspecified: Secondary | ICD-10-CM | POA: Diagnosis not present

## 2015-05-26 DIAGNOSIS — I2510 Atherosclerotic heart disease of native coronary artery without angina pectoris: Secondary | ICD-10-CM | POA: Diagnosis not present

## 2015-05-26 DIAGNOSIS — M159 Polyosteoarthritis, unspecified: Secondary | ICD-10-CM | POA: Diagnosis not present

## 2015-05-26 DIAGNOSIS — G2581 Restless legs syndrome: Secondary | ICD-10-CM | POA: Diagnosis not present

## 2015-05-26 DIAGNOSIS — D509 Iron deficiency anemia, unspecified: Secondary | ICD-10-CM | POA: Diagnosis not present

## 2015-05-27 DIAGNOSIS — Z7982 Long term (current) use of aspirin: Secondary | ICD-10-CM | POA: Diagnosis not present

## 2015-05-27 DIAGNOSIS — Z950 Presence of cardiac pacemaker: Secondary | ICD-10-CM | POA: Diagnosis not present

## 2015-05-27 DIAGNOSIS — K289 Gastrojejunal ulcer, unspecified as acute or chronic, without hemorrhage or perforation: Secondary | ICD-10-CM | POA: Diagnosis not present

## 2015-05-27 DIAGNOSIS — Z79899 Other long term (current) drug therapy: Secondary | ICD-10-CM | POA: Diagnosis not present

## 2015-05-27 DIAGNOSIS — I509 Heart failure, unspecified: Secondary | ICD-10-CM | POA: Diagnosis not present

## 2015-05-27 DIAGNOSIS — H409 Unspecified glaucoma: Secondary | ICD-10-CM | POA: Diagnosis not present

## 2015-05-27 DIAGNOSIS — K5900 Constipation, unspecified: Secondary | ICD-10-CM | POA: Diagnosis not present

## 2015-05-27 DIAGNOSIS — G2581 Restless legs syndrome: Secondary | ICD-10-CM | POA: Diagnosis not present

## 2015-05-27 DIAGNOSIS — K279 Peptic ulcer, site unspecified, unspecified as acute or chronic, without hemorrhage or perforation: Secondary | ICD-10-CM | POA: Diagnosis not present

## 2015-05-27 DIAGNOSIS — I2510 Atherosclerotic heart disease of native coronary artery without angina pectoris: Secondary | ICD-10-CM | POA: Diagnosis not present

## 2015-05-27 DIAGNOSIS — K5909 Other constipation: Secondary | ICD-10-CM | POA: Diagnosis not present

## 2015-05-27 DIAGNOSIS — I110 Hypertensive heart disease with heart failure: Secondary | ICD-10-CM | POA: Diagnosis not present

## 2015-05-27 DIAGNOSIS — F0390 Unspecified dementia without behavioral disturbance: Secondary | ICD-10-CM | POA: Diagnosis not present

## 2015-05-27 DIAGNOSIS — I5032 Chronic diastolic (congestive) heart failure: Secondary | ICD-10-CM | POA: Diagnosis not present

## 2015-05-27 DIAGNOSIS — R1011 Right upper quadrant pain: Secondary | ICD-10-CM | POA: Diagnosis not present

## 2015-05-27 DIAGNOSIS — D509 Iron deficiency anemia, unspecified: Secondary | ICD-10-CM | POA: Diagnosis not present

## 2015-05-27 DIAGNOSIS — M159 Polyosteoarthritis, unspecified: Secondary | ICD-10-CM | POA: Diagnosis not present

## 2015-05-27 DIAGNOSIS — J449 Chronic obstructive pulmonary disease, unspecified: Secondary | ICD-10-CM | POA: Diagnosis not present

## 2015-05-28 DIAGNOSIS — D509 Iron deficiency anemia, unspecified: Secondary | ICD-10-CM | POA: Diagnosis not present

## 2015-05-28 DIAGNOSIS — G2581 Restless legs syndrome: Secondary | ICD-10-CM | POA: Diagnosis not present

## 2015-05-28 DIAGNOSIS — I5032 Chronic diastolic (congestive) heart failure: Secondary | ICD-10-CM | POA: Diagnosis not present

## 2015-05-28 DIAGNOSIS — M159 Polyosteoarthritis, unspecified: Secondary | ICD-10-CM | POA: Diagnosis not present

## 2015-05-28 DIAGNOSIS — I2510 Atherosclerotic heart disease of native coronary artery without angina pectoris: Secondary | ICD-10-CM | POA: Diagnosis not present

## 2015-05-28 DIAGNOSIS — J449 Chronic obstructive pulmonary disease, unspecified: Secondary | ICD-10-CM | POA: Diagnosis not present

## 2015-05-31 ENCOUNTER — Ambulatory Visit
Admission: RE | Admit: 2015-05-31 | Discharge: 2015-05-31 | Disposition: A | Discharge status: Home / Self Care | Payer: Medicare Other | Source: Ambulatory Visit | Attending: Rheumatology | Admitting: Rheumatology

## 2015-05-31 ENCOUNTER — Other Ambulatory Visit: Payer: Self-pay | Admitting: Rheumatology

## 2015-05-31 DIAGNOSIS — M353 Polymyalgia rheumatica: Secondary | ICD-10-CM | POA: Diagnosis not present

## 2015-05-31 DIAGNOSIS — I5032 Chronic diastolic (congestive) heart failure: Secondary | ICD-10-CM | POA: Diagnosis not present

## 2015-05-31 DIAGNOSIS — M25551 Pain in right hip: Secondary | ICD-10-CM | POA: Diagnosis not present

## 2015-05-31 DIAGNOSIS — M25562 Pain in left knee: Secondary | ICD-10-CM | POA: Diagnosis not present

## 2015-05-31 DIAGNOSIS — D509 Iron deficiency anemia, unspecified: Secondary | ICD-10-CM | POA: Diagnosis not present

## 2015-05-31 DIAGNOSIS — M159 Polyosteoarthritis, unspecified: Secondary | ICD-10-CM | POA: Diagnosis not present

## 2015-05-31 DIAGNOSIS — G2581 Restless legs syndrome: Secondary | ICD-10-CM | POA: Diagnosis not present

## 2015-05-31 DIAGNOSIS — I2510 Atherosclerotic heart disease of native coronary artery without angina pectoris: Secondary | ICD-10-CM | POA: Diagnosis not present

## 2015-05-31 DIAGNOSIS — Z79899 Other long term (current) drug therapy: Secondary | ICD-10-CM | POA: Diagnosis not present

## 2015-05-31 DIAGNOSIS — J449 Chronic obstructive pulmonary disease, unspecified: Secondary | ICD-10-CM | POA: Diagnosis not present

## 2015-06-02 DIAGNOSIS — I2510 Atherosclerotic heart disease of native coronary artery without angina pectoris: Secondary | ICD-10-CM | POA: Diagnosis not present

## 2015-06-02 DIAGNOSIS — D509 Iron deficiency anemia, unspecified: Secondary | ICD-10-CM | POA: Diagnosis not present

## 2015-06-02 DIAGNOSIS — G2581 Restless legs syndrome: Secondary | ICD-10-CM | POA: Diagnosis not present

## 2015-06-02 DIAGNOSIS — I5032 Chronic diastolic (congestive) heart failure: Secondary | ICD-10-CM | POA: Diagnosis not present

## 2015-06-02 DIAGNOSIS — J449 Chronic obstructive pulmonary disease, unspecified: Secondary | ICD-10-CM | POA: Diagnosis not present

## 2015-06-02 DIAGNOSIS — M159 Polyosteoarthritis, unspecified: Secondary | ICD-10-CM | POA: Diagnosis not present

## 2015-06-04 DIAGNOSIS — I5032 Chronic diastolic (congestive) heart failure: Secondary | ICD-10-CM | POA: Diagnosis not present

## 2015-06-04 DIAGNOSIS — M159 Polyosteoarthritis, unspecified: Secondary | ICD-10-CM | POA: Diagnosis not present

## 2015-06-04 DIAGNOSIS — D509 Iron deficiency anemia, unspecified: Secondary | ICD-10-CM | POA: Diagnosis not present

## 2015-06-04 DIAGNOSIS — G2581 Restless legs syndrome: Secondary | ICD-10-CM | POA: Diagnosis not present

## 2015-06-04 DIAGNOSIS — J449 Chronic obstructive pulmonary disease, unspecified: Secondary | ICD-10-CM | POA: Diagnosis not present

## 2015-06-04 DIAGNOSIS — I2510 Atherosclerotic heart disease of native coronary artery without angina pectoris: Secondary | ICD-10-CM | POA: Diagnosis not present

## 2015-06-07 DIAGNOSIS — D509 Iron deficiency anemia, unspecified: Secondary | ICD-10-CM | POA: Diagnosis not present

## 2015-06-07 DIAGNOSIS — I2510 Atherosclerotic heart disease of native coronary artery without angina pectoris: Secondary | ICD-10-CM | POA: Diagnosis not present

## 2015-06-07 DIAGNOSIS — I5032 Chronic diastolic (congestive) heart failure: Secondary | ICD-10-CM | POA: Diagnosis not present

## 2015-06-07 DIAGNOSIS — G2581 Restless legs syndrome: Secondary | ICD-10-CM | POA: Diagnosis not present

## 2015-06-07 DIAGNOSIS — M159 Polyosteoarthritis, unspecified: Secondary | ICD-10-CM | POA: Diagnosis not present

## 2015-06-07 DIAGNOSIS — J449 Chronic obstructive pulmonary disease, unspecified: Secondary | ICD-10-CM | POA: Diagnosis not present

## 2015-06-08 DIAGNOSIS — J449 Chronic obstructive pulmonary disease, unspecified: Secondary | ICD-10-CM | POA: Diagnosis not present

## 2015-06-08 DIAGNOSIS — M25561 Pain in right knee: Secondary | ICD-10-CM | POA: Diagnosis not present

## 2015-06-08 DIAGNOSIS — D509 Iron deficiency anemia, unspecified: Secondary | ICD-10-CM | POA: Diagnosis not present

## 2015-06-08 DIAGNOSIS — M7061 Trochanteric bursitis, right hip: Secondary | ICD-10-CM | POA: Diagnosis not present

## 2015-06-08 DIAGNOSIS — I5032 Chronic diastolic (congestive) heart failure: Secondary | ICD-10-CM | POA: Diagnosis not present

## 2015-06-08 DIAGNOSIS — M159 Polyosteoarthritis, unspecified: Secondary | ICD-10-CM | POA: Diagnosis not present

## 2015-06-08 DIAGNOSIS — I2510 Atherosclerotic heart disease of native coronary artery without angina pectoris: Secondary | ICD-10-CM | POA: Diagnosis not present

## 2015-06-08 DIAGNOSIS — M545 Low back pain: Secondary | ICD-10-CM | POA: Diagnosis not present

## 2015-06-08 DIAGNOSIS — G2581 Restless legs syndrome: Secondary | ICD-10-CM | POA: Diagnosis not present

## 2015-06-09 DIAGNOSIS — I5032 Chronic diastolic (congestive) heart failure: Secondary | ICD-10-CM | POA: Diagnosis not present

## 2015-06-09 DIAGNOSIS — G2581 Restless legs syndrome: Secondary | ICD-10-CM | POA: Diagnosis not present

## 2015-06-09 DIAGNOSIS — I2510 Atherosclerotic heart disease of native coronary artery without angina pectoris: Secondary | ICD-10-CM | POA: Diagnosis not present

## 2015-06-09 DIAGNOSIS — J449 Chronic obstructive pulmonary disease, unspecified: Secondary | ICD-10-CM | POA: Diagnosis not present

## 2015-06-09 DIAGNOSIS — D509 Iron deficiency anemia, unspecified: Secondary | ICD-10-CM | POA: Diagnosis not present

## 2015-06-09 DIAGNOSIS — M159 Polyosteoarthritis, unspecified: Secondary | ICD-10-CM | POA: Diagnosis not present

## 2015-06-11 DIAGNOSIS — D509 Iron deficiency anemia, unspecified: Secondary | ICD-10-CM | POA: Diagnosis not present

## 2015-06-11 DIAGNOSIS — I2510 Atherosclerotic heart disease of native coronary artery without angina pectoris: Secondary | ICD-10-CM | POA: Diagnosis not present

## 2015-06-11 DIAGNOSIS — M159 Polyosteoarthritis, unspecified: Secondary | ICD-10-CM | POA: Diagnosis not present

## 2015-06-11 DIAGNOSIS — I5032 Chronic diastolic (congestive) heart failure: Secondary | ICD-10-CM | POA: Diagnosis not present

## 2015-06-11 DIAGNOSIS — J449 Chronic obstructive pulmonary disease, unspecified: Secondary | ICD-10-CM | POA: Diagnosis not present

## 2015-06-11 DIAGNOSIS — G2581 Restless legs syndrome: Secondary | ICD-10-CM | POA: Diagnosis not present

## 2015-06-14 ENCOUNTER — Other Ambulatory Visit (HOSPITAL_COMMUNITY): Payer: Self-pay | Admitting: Rheumatology

## 2015-06-14 DIAGNOSIS — J449 Chronic obstructive pulmonary disease, unspecified: Secondary | ICD-10-CM | POA: Diagnosis not present

## 2015-06-14 DIAGNOSIS — I2510 Atherosclerotic heart disease of native coronary artery without angina pectoris: Secondary | ICD-10-CM | POA: Diagnosis not present

## 2015-06-14 DIAGNOSIS — D509 Iron deficiency anemia, unspecified: Secondary | ICD-10-CM | POA: Diagnosis not present

## 2015-06-14 DIAGNOSIS — M159 Polyosteoarthritis, unspecified: Secondary | ICD-10-CM | POA: Diagnosis not present

## 2015-06-14 DIAGNOSIS — I5032 Chronic diastolic (congestive) heart failure: Secondary | ICD-10-CM | POA: Diagnosis not present

## 2015-06-14 DIAGNOSIS — G2581 Restless legs syndrome: Secondary | ICD-10-CM | POA: Diagnosis not present

## 2015-06-15 ENCOUNTER — Other Ambulatory Visit (HOSPITAL_COMMUNITY): Payer: Self-pay | Admitting: Rheumatology

## 2015-06-15 ENCOUNTER — Ambulatory Visit: Payer: Medicare Other | Admitting: Orthopaedic Surgery

## 2015-06-15 DIAGNOSIS — M5416 Radiculopathy, lumbar region: Secondary | ICD-10-CM

## 2015-06-17 ENCOUNTER — Ambulatory Visit (HOSPITAL_COMMUNITY)
Admission: RE | Admit: 2015-06-17 | Discharge: 2015-06-17 | Disposition: A | Discharge status: Home / Self Care | Payer: Medicare Other | Source: Ambulatory Visit | Attending: Rheumatology | Admitting: Rheumatology

## 2015-06-17 DIAGNOSIS — M4726 Other spondylosis with radiculopathy, lumbar region: Secondary | ICD-10-CM | POA: Diagnosis not present

## 2015-06-17 DIAGNOSIS — M5416 Radiculopathy, lumbar region: Secondary | ICD-10-CM

## 2015-06-17 DIAGNOSIS — M47816 Spondylosis without myelopathy or radiculopathy, lumbar region: Secondary | ICD-10-CM | POA: Diagnosis not present

## 2015-06-18 ENCOUNTER — Other Ambulatory Visit: Payer: Self-pay | Admitting: Rheumatology

## 2015-06-18 DIAGNOSIS — I509 Heart failure, unspecified: Secondary | ICD-10-CM | POA: Diagnosis not present

## 2015-06-18 DIAGNOSIS — Z950 Presence of cardiac pacemaker: Secondary | ICD-10-CM | POA: Diagnosis not present

## 2015-06-18 DIAGNOSIS — G8929 Other chronic pain: Secondary | ICD-10-CM

## 2015-06-18 DIAGNOSIS — E039 Hypothyroidism, unspecified: Secondary | ICD-10-CM | POA: Diagnosis not present

## 2015-06-18 DIAGNOSIS — J449 Chronic obstructive pulmonary disease, unspecified: Secondary | ICD-10-CM | POA: Diagnosis not present

## 2015-06-18 DIAGNOSIS — M545 Low back pain, unspecified: Secondary | ICD-10-CM

## 2015-06-18 DIAGNOSIS — R410 Disorientation, unspecified: Secondary | ICD-10-CM | POA: Diagnosis not present

## 2015-06-18 DIAGNOSIS — G2581 Restless legs syndrome: Secondary | ICD-10-CM | POA: Diagnosis not present

## 2015-06-22 DIAGNOSIS — M545 Low back pain: Secondary | ICD-10-CM | POA: Diagnosis not present

## 2015-06-22 DIAGNOSIS — J449 Chronic obstructive pulmonary disease, unspecified: Secondary | ICD-10-CM | POA: Diagnosis not present

## 2015-06-22 DIAGNOSIS — I10 Essential (primary) hypertension: Secondary | ICD-10-CM | POA: Diagnosis not present

## 2015-06-28 DIAGNOSIS — J449 Chronic obstructive pulmonary disease, unspecified: Secondary | ICD-10-CM | POA: Diagnosis not present

## 2015-06-28 DIAGNOSIS — M159 Polyosteoarthritis, unspecified: Secondary | ICD-10-CM | POA: Diagnosis not present

## 2015-06-28 DIAGNOSIS — I2510 Atherosclerotic heart disease of native coronary artery without angina pectoris: Secondary | ICD-10-CM | POA: Diagnosis not present

## 2015-06-28 DIAGNOSIS — D509 Iron deficiency anemia, unspecified: Secondary | ICD-10-CM | POA: Diagnosis not present

## 2015-06-28 DIAGNOSIS — G2581 Restless legs syndrome: Secondary | ICD-10-CM | POA: Diagnosis not present

## 2015-06-28 DIAGNOSIS — I5032 Chronic diastolic (congestive) heart failure: Secondary | ICD-10-CM | POA: Diagnosis not present

## 2015-06-29 DIAGNOSIS — I10 Essential (primary) hypertension: Secondary | ICD-10-CM | POA: Diagnosis not present

## 2015-06-30 DIAGNOSIS — E039 Hypothyroidism, unspecified: Secondary | ICD-10-CM | POA: Diagnosis not present

## 2015-06-30 DIAGNOSIS — G2581 Restless legs syndrome: Secondary | ICD-10-CM | POA: Diagnosis not present

## 2015-06-30 DIAGNOSIS — I10 Essential (primary) hypertension: Secondary | ICD-10-CM | POA: Diagnosis not present

## 2015-06-30 DIAGNOSIS — M1990 Unspecified osteoarthritis, unspecified site: Secondary | ICD-10-CM | POA: Diagnosis not present

## 2015-06-30 DIAGNOSIS — J449 Chronic obstructive pulmonary disease, unspecified: Secondary | ICD-10-CM | POA: Diagnosis not present

## 2015-06-30 DIAGNOSIS — M545 Low back pain: Secondary | ICD-10-CM | POA: Diagnosis not present

## 2015-07-01 DIAGNOSIS — Z111 Encounter for screening for respiratory tuberculosis: Secondary | ICD-10-CM | POA: Diagnosis not present

## 2015-07-13 ENCOUNTER — Ambulatory Visit
Admission: RE | Admit: 2015-07-13 | Discharge: 2015-07-13 | Disposition: A | Discharge status: Home / Self Care | Payer: Medicare Other | Source: Ambulatory Visit | Attending: Rheumatology | Admitting: Rheumatology

## 2015-07-13 DIAGNOSIS — M47817 Spondylosis without myelopathy or radiculopathy, lumbosacral region: Secondary | ICD-10-CM | POA: Diagnosis not present

## 2015-07-13 DIAGNOSIS — G2581 Restless legs syndrome: Secondary | ICD-10-CM | POA: Diagnosis not present

## 2015-07-13 DIAGNOSIS — G8929 Other chronic pain: Secondary | ICD-10-CM

## 2015-07-13 DIAGNOSIS — M5127 Other intervertebral disc displacement, lumbosacral region: Secondary | ICD-10-CM | POA: Diagnosis not present

## 2015-07-13 DIAGNOSIS — J449 Chronic obstructive pulmonary disease, unspecified: Secondary | ICD-10-CM | POA: Diagnosis not present

## 2015-07-13 DIAGNOSIS — M159 Polyosteoarthritis, unspecified: Secondary | ICD-10-CM | POA: Diagnosis not present

## 2015-07-13 DIAGNOSIS — I2510 Atherosclerotic heart disease of native coronary artery without angina pectoris: Secondary | ICD-10-CM | POA: Diagnosis not present

## 2015-07-13 DIAGNOSIS — I5032 Chronic diastolic (congestive) heart failure: Secondary | ICD-10-CM | POA: Diagnosis not present

## 2015-07-13 DIAGNOSIS — M545 Low back pain: Principal | ICD-10-CM

## 2015-07-13 DIAGNOSIS — D509 Iron deficiency anemia, unspecified: Secondary | ICD-10-CM | POA: Diagnosis not present

## 2015-07-13 MED ORDER — IOPAMIDOL (ISOVUE-M 200) INJECTION 41%
1.0000 mL | Freq: Once | INTRAMUSCULAR | Status: AC
  Administered 2015-07-13: 1 mL via EPIDURAL

## 2015-07-13 MED ORDER — METHYLPREDNISOLONE ACETATE 40 MG/ML INJ SUSP (RADIOLOG
120.0000 mg | Freq: Once | INTRAMUSCULAR | Status: AC
  Administered 2015-07-13: 120 mg via EPIDURAL

## 2015-07-13 NOTE — Discharge Instructions (Signed)

## 2015-07-19 DIAGNOSIS — H401133 Primary open-angle glaucoma, bilateral, severe stage: Secondary | ICD-10-CM | POA: Diagnosis not present

## 2015-07-21 DIAGNOSIS — F5104 Psychophysiologic insomnia: Secondary | ICD-10-CM | POA: Diagnosis not present

## 2015-07-27 DIAGNOSIS — M545 Low back pain: Secondary | ICD-10-CM | POA: Diagnosis not present

## 2015-07-27 DIAGNOSIS — M5416 Radiculopathy, lumbar region: Secondary | ICD-10-CM | POA: Diagnosis not present

## 2015-07-27 DIAGNOSIS — M25561 Pain in right knee: Secondary | ICD-10-CM | POA: Diagnosis not present

## 2015-07-27 DIAGNOSIS — Z79899 Other long term (current) drug therapy: Secondary | ICD-10-CM | POA: Diagnosis not present

## 2015-07-27 DIAGNOSIS — M5127 Other intervertebral disc displacement, lumbosacral region: Secondary | ICD-10-CM | POA: Diagnosis not present

## 2015-07-28 ENCOUNTER — Other Ambulatory Visit: Payer: Self-pay | Admitting: Rheumatology

## 2015-07-28 DIAGNOSIS — M5127 Other intervertebral disc displacement, lumbosacral region: Secondary | ICD-10-CM

## 2015-08-05 ENCOUNTER — Other Ambulatory Visit: Payer: Self-pay | Admitting: Rheumatology

## 2015-08-05 ENCOUNTER — Ambulatory Visit
Admission: RE | Admit: 2015-08-05 | Discharge: 2015-08-05 | Disposition: A | Discharge status: Home / Self Care | Payer: Medicare Other | Source: Ambulatory Visit | Attending: Rheumatology | Admitting: Rheumatology

## 2015-08-05 DIAGNOSIS — M5127 Other intervertebral disc displacement, lumbosacral region: Secondary | ICD-10-CM

## 2015-08-05 DIAGNOSIS — M545 Low back pain: Secondary | ICD-10-CM | POA: Diagnosis not present

## 2015-08-05 MED ORDER — METHYLPREDNISOLONE ACETATE 40 MG/ML INJ SUSP (RADIOLOG
120.0000 mg | Freq: Once | INTRAMUSCULAR | Status: AC
  Administered 2015-08-05: 120 mg via EPIDURAL

## 2015-08-05 MED ORDER — IOPAMIDOL (ISOVUE-M 200) INJECTION 41%
1.0000 mL | Freq: Once | INTRAMUSCULAR | Status: AC
  Administered 2015-08-05: 1 mL via EPIDURAL

## 2015-08-05 NOTE — Discharge Instructions (Signed)

## 2015-08-09 DIAGNOSIS — E7800 Pure hypercholesterolemia, unspecified: Secondary | ICD-10-CM | POA: Diagnosis not present

## 2015-08-09 DIAGNOSIS — E039 Hypothyroidism, unspecified: Secondary | ICD-10-CM | POA: Diagnosis not present

## 2015-08-09 DIAGNOSIS — R5383 Other fatigue: Secondary | ICD-10-CM | POA: Diagnosis not present

## 2015-08-09 DIAGNOSIS — Z13220 Encounter for screening for lipoid disorders: Secondary | ICD-10-CM | POA: Diagnosis not present

## 2015-08-09 DIAGNOSIS — I10 Essential (primary) hypertension: Secondary | ICD-10-CM | POA: Diagnosis not present

## 2015-08-10 DIAGNOSIS — R300 Dysuria: Secondary | ICD-10-CM | POA: Diagnosis not present

## 2015-08-10 DIAGNOSIS — L570 Actinic keratosis: Secondary | ICD-10-CM | POA: Diagnosis not present

## 2015-08-12 DIAGNOSIS — E039 Hypothyroidism, unspecified: Secondary | ICD-10-CM | POA: Diagnosis not present

## 2015-08-12 DIAGNOSIS — F5104 Psychophysiologic insomnia: Secondary | ICD-10-CM | POA: Diagnosis not present

## 2015-08-12 DIAGNOSIS — I509 Heart failure, unspecified: Secondary | ICD-10-CM | POA: Diagnosis not present

## 2015-08-12 DIAGNOSIS — I10 Essential (primary) hypertension: Secondary | ICD-10-CM | POA: Diagnosis not present

## 2015-08-12 DIAGNOSIS — J449 Chronic obstructive pulmonary disease, unspecified: Secondary | ICD-10-CM | POA: Diagnosis not present

## 2015-08-12 DIAGNOSIS — E559 Vitamin D deficiency, unspecified: Secondary | ICD-10-CM | POA: Diagnosis not present

## 2015-08-12 DIAGNOSIS — G2581 Restless legs syndrome: Secondary | ICD-10-CM | POA: Diagnosis not present

## 2015-08-12 DIAGNOSIS — Z6820 Body mass index (BMI) 20.0-20.9, adult: Secondary | ICD-10-CM | POA: Diagnosis not present

## 2015-08-26 DIAGNOSIS — M5416 Radiculopathy, lumbar region: Secondary | ICD-10-CM | POA: Diagnosis not present

## 2015-08-26 DIAGNOSIS — M25561 Pain in right knee: Secondary | ICD-10-CM | POA: Diagnosis not present

## 2015-08-26 DIAGNOSIS — M25562 Pain in left knee: Secondary | ICD-10-CM | POA: Diagnosis not present

## 2015-08-26 DIAGNOSIS — M545 Low back pain: Secondary | ICD-10-CM | POA: Diagnosis not present

## 2015-08-30 DIAGNOSIS — R300 Dysuria: Secondary | ICD-10-CM | POA: Diagnosis not present

## 2015-08-30 DIAGNOSIS — N905 Atrophy of vulva: Secondary | ICD-10-CM | POA: Diagnosis not present

## 2015-09-01 DIAGNOSIS — Z880 Allergy status to penicillin: Secondary | ICD-10-CM | POA: Diagnosis not present

## 2015-09-01 DIAGNOSIS — K5900 Constipation, unspecified: Secondary | ICD-10-CM | POA: Diagnosis not present

## 2015-09-01 DIAGNOSIS — Z950 Presence of cardiac pacemaker: Secondary | ICD-10-CM | POA: Diagnosis not present

## 2015-09-01 DIAGNOSIS — G4700 Insomnia, unspecified: Secondary | ICD-10-CM | POA: Diagnosis not present

## 2015-09-01 DIAGNOSIS — Z79899 Other long term (current) drug therapy: Secondary | ICD-10-CM | POA: Diagnosis not present

## 2015-09-01 DIAGNOSIS — J449 Chronic obstructive pulmonary disease, unspecified: Secondary | ICD-10-CM | POA: Diagnosis not present

## 2015-09-01 DIAGNOSIS — I341 Nonrheumatic mitral (valve) prolapse: Secondary | ICD-10-CM | POA: Diagnosis not present

## 2015-09-01 DIAGNOSIS — Z885 Allergy status to narcotic agent status: Secondary | ICD-10-CM | POA: Diagnosis not present

## 2015-09-01 DIAGNOSIS — Z882 Allergy status to sulfonamides status: Secondary | ICD-10-CM | POA: Diagnosis not present

## 2015-09-01 DIAGNOSIS — H409 Unspecified glaucoma: Secondary | ICD-10-CM | POA: Diagnosis not present

## 2015-09-01 DIAGNOSIS — G459 Transient cerebral ischemic attack, unspecified: Secondary | ICD-10-CM | POA: Diagnosis not present

## 2015-09-01 DIAGNOSIS — G2581 Restless legs syndrome: Secondary | ICD-10-CM | POA: Diagnosis not present

## 2015-09-01 DIAGNOSIS — H538 Other visual disturbances: Secondary | ICD-10-CM | POA: Diagnosis not present

## 2015-09-01 DIAGNOSIS — R0602 Shortness of breath: Secondary | ICD-10-CM | POA: Diagnosis not present

## 2015-09-01 DIAGNOSIS — R0682 Tachypnea, not elsewhere classified: Secondary | ICD-10-CM | POA: Diagnosis not present

## 2015-09-01 DIAGNOSIS — M1990 Unspecified osteoarthritis, unspecified site: Secondary | ICD-10-CM | POA: Diagnosis not present

## 2015-09-01 DIAGNOSIS — R109 Unspecified abdominal pain: Secondary | ICD-10-CM | POA: Diagnosis not present

## 2015-09-01 DIAGNOSIS — I482 Chronic atrial fibrillation: Secondary | ICD-10-CM | POA: Diagnosis not present

## 2015-09-01 DIAGNOSIS — I209 Angina pectoris, unspecified: Secondary | ICD-10-CM | POA: Diagnosis not present

## 2015-09-01 DIAGNOSIS — Z888 Allergy status to other drugs, medicaments and biological substances status: Secondary | ICD-10-CM | POA: Diagnosis not present

## 2015-09-01 DIAGNOSIS — I6789 Other cerebrovascular disease: Secondary | ICD-10-CM | POA: Diagnosis not present

## 2015-09-01 DIAGNOSIS — I10 Essential (primary) hypertension: Secondary | ICD-10-CM | POA: Diagnosis not present

## 2015-09-02 DIAGNOSIS — R0602 Shortness of breath: Secondary | ICD-10-CM | POA: Diagnosis not present

## 2015-09-02 DIAGNOSIS — G459 Transient cerebral ischemic attack, unspecified: Secondary | ICD-10-CM | POA: Diagnosis not present

## 2015-09-02 DIAGNOSIS — I6523 Occlusion and stenosis of bilateral carotid arteries: Secondary | ICD-10-CM | POA: Diagnosis not present

## 2015-09-03 DIAGNOSIS — I482 Chronic atrial fibrillation: Secondary | ICD-10-CM | POA: Diagnosis not present

## 2015-09-03 DIAGNOSIS — J449 Chronic obstructive pulmonary disease, unspecified: Secondary | ICD-10-CM | POA: Diagnosis not present

## 2015-09-03 DIAGNOSIS — K5900 Constipation, unspecified: Secondary | ICD-10-CM | POA: Diagnosis not present

## 2015-09-03 DIAGNOSIS — G459 Transient cerebral ischemic attack, unspecified: Secondary | ICD-10-CM | POA: Diagnosis not present

## 2015-09-04 DIAGNOSIS — G459 Transient cerebral ischemic attack, unspecified: Secondary | ICD-10-CM | POA: Diagnosis not present

## 2015-09-04 DIAGNOSIS — I482 Chronic atrial fibrillation: Secondary | ICD-10-CM | POA: Diagnosis not present

## 2015-09-04 DIAGNOSIS — K5900 Constipation, unspecified: Secondary | ICD-10-CM | POA: Diagnosis not present

## 2015-09-04 DIAGNOSIS — J449 Chronic obstructive pulmonary disease, unspecified: Secondary | ICD-10-CM | POA: Diagnosis not present

## 2015-09-06 DIAGNOSIS — I209 Angina pectoris, unspecified: Secondary | ICD-10-CM | POA: Diagnosis not present

## 2015-09-06 DIAGNOSIS — Z7982 Long term (current) use of aspirin: Secondary | ICD-10-CM | POA: Diagnosis not present

## 2015-09-06 DIAGNOSIS — Z950 Presence of cardiac pacemaker: Secondary | ICD-10-CM | POA: Diagnosis not present

## 2015-09-06 DIAGNOSIS — I110 Hypertensive heart disease with heart failure: Secondary | ICD-10-CM | POA: Diagnosis not present

## 2015-09-06 DIAGNOSIS — M1991 Primary osteoarthritis, unspecified site: Secondary | ICD-10-CM | POA: Diagnosis not present

## 2015-09-06 DIAGNOSIS — H9193 Unspecified hearing loss, bilateral: Secondary | ICD-10-CM | POA: Diagnosis not present

## 2015-09-06 DIAGNOSIS — G2581 Restless legs syndrome: Secondary | ICD-10-CM | POA: Diagnosis not present

## 2015-09-06 DIAGNOSIS — I509 Heart failure, unspecified: Secondary | ICD-10-CM | POA: Diagnosis not present

## 2015-09-06 DIAGNOSIS — I059 Rheumatic mitral valve disease, unspecified: Secondary | ICD-10-CM | POA: Diagnosis not present

## 2015-09-06 DIAGNOSIS — H409 Unspecified glaucoma: Secondary | ICD-10-CM | POA: Diagnosis not present

## 2015-09-06 DIAGNOSIS — M810 Age-related osteoporosis without current pathological fracture: Secondary | ICD-10-CM | POA: Diagnosis not present

## 2015-09-06 DIAGNOSIS — J449 Chronic obstructive pulmonary disease, unspecified: Secondary | ICD-10-CM | POA: Diagnosis not present

## 2015-09-06 DIAGNOSIS — K5909 Other constipation: Secondary | ICD-10-CM | POA: Diagnosis not present

## 2015-09-06 DIAGNOSIS — M419 Scoliosis, unspecified: Secondary | ICD-10-CM | POA: Diagnosis not present

## 2015-09-07 DIAGNOSIS — K5909 Other constipation: Secondary | ICD-10-CM | POA: Diagnosis not present

## 2015-09-07 DIAGNOSIS — J449 Chronic obstructive pulmonary disease, unspecified: Secondary | ICD-10-CM | POA: Diagnosis not present

## 2015-09-07 DIAGNOSIS — I209 Angina pectoris, unspecified: Secondary | ICD-10-CM | POA: Diagnosis not present

## 2015-09-07 DIAGNOSIS — I110 Hypertensive heart disease with heart failure: Secondary | ICD-10-CM | POA: Diagnosis not present

## 2015-09-07 DIAGNOSIS — I059 Rheumatic mitral valve disease, unspecified: Secondary | ICD-10-CM | POA: Diagnosis not present

## 2015-09-07 DIAGNOSIS — I509 Heart failure, unspecified: Secondary | ICD-10-CM | POA: Diagnosis not present

## 2015-09-09 DIAGNOSIS — I059 Rheumatic mitral valve disease, unspecified: Secondary | ICD-10-CM | POA: Diagnosis not present

## 2015-09-09 DIAGNOSIS — K5909 Other constipation: Secondary | ICD-10-CM | POA: Diagnosis not present

## 2015-09-09 DIAGNOSIS — I509 Heart failure, unspecified: Secondary | ICD-10-CM | POA: Diagnosis not present

## 2015-09-09 DIAGNOSIS — I209 Angina pectoris, unspecified: Secondary | ICD-10-CM | POA: Diagnosis not present

## 2015-09-09 DIAGNOSIS — J449 Chronic obstructive pulmonary disease, unspecified: Secondary | ICD-10-CM | POA: Diagnosis not present

## 2015-09-09 DIAGNOSIS — I110 Hypertensive heart disease with heart failure: Secondary | ICD-10-CM | POA: Diagnosis not present

## 2015-09-10 DIAGNOSIS — I209 Angina pectoris, unspecified: Secondary | ICD-10-CM | POA: Diagnosis not present

## 2015-09-10 DIAGNOSIS — J449 Chronic obstructive pulmonary disease, unspecified: Secondary | ICD-10-CM | POA: Diagnosis not present

## 2015-09-10 DIAGNOSIS — K5909 Other constipation: Secondary | ICD-10-CM | POA: Diagnosis not present

## 2015-09-10 DIAGNOSIS — I059 Rheumatic mitral valve disease, unspecified: Secondary | ICD-10-CM | POA: Diagnosis not present

## 2015-09-10 DIAGNOSIS — I110 Hypertensive heart disease with heart failure: Secondary | ICD-10-CM | POA: Diagnosis not present

## 2015-09-10 DIAGNOSIS — I509 Heart failure, unspecified: Secondary | ICD-10-CM | POA: Diagnosis not present

## 2015-09-14 DIAGNOSIS — I509 Heart failure, unspecified: Secondary | ICD-10-CM | POA: Diagnosis not present

## 2015-09-14 DIAGNOSIS — K5909 Other constipation: Secondary | ICD-10-CM | POA: Diagnosis not present

## 2015-09-14 DIAGNOSIS — I209 Angina pectoris, unspecified: Secondary | ICD-10-CM | POA: Diagnosis not present

## 2015-09-14 DIAGNOSIS — I110 Hypertensive heart disease with heart failure: Secondary | ICD-10-CM | POA: Diagnosis not present

## 2015-09-14 DIAGNOSIS — J449 Chronic obstructive pulmonary disease, unspecified: Secondary | ICD-10-CM | POA: Diagnosis not present

## 2015-09-14 DIAGNOSIS — I059 Rheumatic mitral valve disease, unspecified: Secondary | ICD-10-CM | POA: Diagnosis not present

## 2015-09-15 DIAGNOSIS — J449 Chronic obstructive pulmonary disease, unspecified: Secondary | ICD-10-CM | POA: Diagnosis not present

## 2015-09-15 DIAGNOSIS — Z6821 Body mass index (BMI) 21.0-21.9, adult: Secondary | ICD-10-CM | POA: Diagnosis not present

## 2015-09-15 DIAGNOSIS — I509 Heart failure, unspecified: Secondary | ICD-10-CM | POA: Diagnosis not present

## 2015-09-15 DIAGNOSIS — K5909 Other constipation: Secondary | ICD-10-CM | POA: Diagnosis not present

## 2015-09-15 DIAGNOSIS — G459 Transient cerebral ischemic attack, unspecified: Secondary | ICD-10-CM | POA: Diagnosis not present

## 2015-09-15 DIAGNOSIS — I110 Hypertensive heart disease with heart failure: Secondary | ICD-10-CM | POA: Diagnosis not present

## 2015-09-15 DIAGNOSIS — I059 Rheumatic mitral valve disease, unspecified: Secondary | ICD-10-CM | POA: Diagnosis not present

## 2015-09-15 DIAGNOSIS — I10 Essential (primary) hypertension: Secondary | ICD-10-CM | POA: Diagnosis not present

## 2015-09-15 DIAGNOSIS — I209 Angina pectoris, unspecified: Secondary | ICD-10-CM | POA: Diagnosis not present

## 2015-09-16 DIAGNOSIS — I110 Hypertensive heart disease with heart failure: Secondary | ICD-10-CM | POA: Diagnosis not present

## 2015-09-16 DIAGNOSIS — I059 Rheumatic mitral valve disease, unspecified: Secondary | ICD-10-CM | POA: Diagnosis not present

## 2015-09-16 DIAGNOSIS — J449 Chronic obstructive pulmonary disease, unspecified: Secondary | ICD-10-CM | POA: Diagnosis not present

## 2015-09-16 DIAGNOSIS — I509 Heart failure, unspecified: Secondary | ICD-10-CM | POA: Diagnosis not present

## 2015-09-16 DIAGNOSIS — K5909 Other constipation: Secondary | ICD-10-CM | POA: Diagnosis not present

## 2015-09-16 DIAGNOSIS — I209 Angina pectoris, unspecified: Secondary | ICD-10-CM | POA: Diagnosis not present

## 2015-09-21 DIAGNOSIS — I059 Rheumatic mitral valve disease, unspecified: Secondary | ICD-10-CM | POA: Diagnosis not present

## 2015-09-21 DIAGNOSIS — I509 Heart failure, unspecified: Secondary | ICD-10-CM | POA: Diagnosis not present

## 2015-09-21 DIAGNOSIS — K5909 Other constipation: Secondary | ICD-10-CM | POA: Diagnosis not present

## 2015-09-21 DIAGNOSIS — I209 Angina pectoris, unspecified: Secondary | ICD-10-CM | POA: Diagnosis not present

## 2015-09-21 DIAGNOSIS — J449 Chronic obstructive pulmonary disease, unspecified: Secondary | ICD-10-CM | POA: Diagnosis not present

## 2015-09-21 DIAGNOSIS — I110 Hypertensive heart disease with heart failure: Secondary | ICD-10-CM | POA: Diagnosis not present

## 2015-09-22 DIAGNOSIS — I110 Hypertensive heart disease with heart failure: Secondary | ICD-10-CM | POA: Diagnosis not present

## 2015-09-22 DIAGNOSIS — J449 Chronic obstructive pulmonary disease, unspecified: Secondary | ICD-10-CM | POA: Diagnosis not present

## 2015-09-22 DIAGNOSIS — I509 Heart failure, unspecified: Secondary | ICD-10-CM | POA: Diagnosis not present

## 2015-09-22 DIAGNOSIS — I059 Rheumatic mitral valve disease, unspecified: Secondary | ICD-10-CM | POA: Diagnosis not present

## 2015-09-22 DIAGNOSIS — I209 Angina pectoris, unspecified: Secondary | ICD-10-CM | POA: Diagnosis not present

## 2015-09-22 DIAGNOSIS — K5909 Other constipation: Secondary | ICD-10-CM | POA: Diagnosis not present

## 2015-09-22 DIAGNOSIS — K210 Gastro-esophageal reflux disease with esophagitis: Secondary | ICD-10-CM | POA: Diagnosis not present

## 2015-09-22 DIAGNOSIS — I10 Essential (primary) hypertension: Secondary | ICD-10-CM | POA: Diagnosis not present

## 2015-09-22 DIAGNOSIS — R609 Edema, unspecified: Secondary | ICD-10-CM | POA: Diagnosis not present

## 2015-09-22 DIAGNOSIS — Z6821 Body mass index (BMI) 21.0-21.9, adult: Secondary | ICD-10-CM | POA: Diagnosis not present

## 2015-09-23 DIAGNOSIS — I209 Angina pectoris, unspecified: Secondary | ICD-10-CM | POA: Diagnosis not present

## 2015-09-23 DIAGNOSIS — K5909 Other constipation: Secondary | ICD-10-CM | POA: Diagnosis not present

## 2015-09-23 DIAGNOSIS — I509 Heart failure, unspecified: Secondary | ICD-10-CM | POA: Diagnosis not present

## 2015-09-23 DIAGNOSIS — J449 Chronic obstructive pulmonary disease, unspecified: Secondary | ICD-10-CM | POA: Diagnosis not present

## 2015-09-23 DIAGNOSIS — I110 Hypertensive heart disease with heart failure: Secondary | ICD-10-CM | POA: Diagnosis not present

## 2015-09-23 DIAGNOSIS — I059 Rheumatic mitral valve disease, unspecified: Secondary | ICD-10-CM | POA: Diagnosis not present

## 2015-09-28 DIAGNOSIS — H11823 Conjunctivochalasis, bilateral: Secondary | ICD-10-CM | POA: Diagnosis not present

## 2015-09-28 DIAGNOSIS — H16223 Keratoconjunctivitis sicca, not specified as Sjogren's, bilateral: Secondary | ICD-10-CM | POA: Diagnosis not present

## 2015-09-28 DIAGNOSIS — I209 Angina pectoris, unspecified: Secondary | ICD-10-CM | POA: Diagnosis not present

## 2015-09-28 DIAGNOSIS — H04123 Dry eye syndrome of bilateral lacrimal glands: Secondary | ICD-10-CM | POA: Diagnosis not present

## 2015-09-28 DIAGNOSIS — L209 Atopic dermatitis, unspecified: Secondary | ICD-10-CM | POA: Diagnosis not present

## 2015-09-28 DIAGNOSIS — I059 Rheumatic mitral valve disease, unspecified: Secondary | ICD-10-CM | POA: Diagnosis not present

## 2015-09-28 DIAGNOSIS — J449 Chronic obstructive pulmonary disease, unspecified: Secondary | ICD-10-CM | POA: Diagnosis not present

## 2015-09-28 DIAGNOSIS — K5909 Other constipation: Secondary | ICD-10-CM | POA: Diagnosis not present

## 2015-09-28 DIAGNOSIS — H5713 Ocular pain, bilateral: Secondary | ICD-10-CM | POA: Diagnosis not present

## 2015-09-28 DIAGNOSIS — I509 Heart failure, unspecified: Secondary | ICD-10-CM | POA: Diagnosis not present

## 2015-09-28 DIAGNOSIS — I110 Hypertensive heart disease with heart failure: Secondary | ICD-10-CM | POA: Diagnosis not present

## 2015-09-29 ENCOUNTER — Other Ambulatory Visit: Payer: Self-pay

## 2015-09-30 DIAGNOSIS — J449 Chronic obstructive pulmonary disease, unspecified: Secondary | ICD-10-CM | POA: Diagnosis not present

## 2015-09-30 DIAGNOSIS — I509 Heart failure, unspecified: Secondary | ICD-10-CM | POA: Diagnosis not present

## 2015-09-30 DIAGNOSIS — I209 Angina pectoris, unspecified: Secondary | ICD-10-CM | POA: Diagnosis not present

## 2015-09-30 DIAGNOSIS — I059 Rheumatic mitral valve disease, unspecified: Secondary | ICD-10-CM | POA: Diagnosis not present

## 2015-09-30 DIAGNOSIS — K5909 Other constipation: Secondary | ICD-10-CM | POA: Diagnosis not present

## 2015-09-30 DIAGNOSIS — I110 Hypertensive heart disease with heart failure: Secondary | ICD-10-CM | POA: Diagnosis not present

## 2015-10-04 DIAGNOSIS — G459 Transient cerebral ischemic attack, unspecified: Secondary | ICD-10-CM | POA: Diagnosis not present

## 2015-10-04 DIAGNOSIS — Z6820 Body mass index (BMI) 20.0-20.9, adult: Secondary | ICD-10-CM | POA: Diagnosis not present

## 2015-10-05 DIAGNOSIS — I110 Hypertensive heart disease with heart failure: Secondary | ICD-10-CM | POA: Diagnosis not present

## 2015-10-05 DIAGNOSIS — I059 Rheumatic mitral valve disease, unspecified: Secondary | ICD-10-CM | POA: Diagnosis not present

## 2015-10-05 DIAGNOSIS — I209 Angina pectoris, unspecified: Secondary | ICD-10-CM | POA: Diagnosis not present

## 2015-10-05 DIAGNOSIS — J449 Chronic obstructive pulmonary disease, unspecified: Secondary | ICD-10-CM | POA: Diagnosis not present

## 2015-10-05 DIAGNOSIS — K5909 Other constipation: Secondary | ICD-10-CM | POA: Diagnosis not present

## 2015-10-05 DIAGNOSIS — I509 Heart failure, unspecified: Secondary | ICD-10-CM | POA: Diagnosis not present

## 2015-10-06 DIAGNOSIS — H401133 Primary open-angle glaucoma, bilateral, severe stage: Secondary | ICD-10-CM | POA: Diagnosis not present

## 2015-10-06 DIAGNOSIS — Z961 Presence of intraocular lens: Secondary | ICD-10-CM | POA: Diagnosis not present

## 2015-10-11 DIAGNOSIS — J449 Chronic obstructive pulmonary disease, unspecified: Secondary | ICD-10-CM | POA: Diagnosis not present

## 2015-10-11 DIAGNOSIS — I209 Angina pectoris, unspecified: Secondary | ICD-10-CM | POA: Diagnosis not present

## 2015-10-11 DIAGNOSIS — K5909 Other constipation: Secondary | ICD-10-CM | POA: Diagnosis not present

## 2015-10-11 DIAGNOSIS — I509 Heart failure, unspecified: Secondary | ICD-10-CM | POA: Diagnosis not present

## 2015-10-11 DIAGNOSIS — I059 Rheumatic mitral valve disease, unspecified: Secondary | ICD-10-CM | POA: Diagnosis not present

## 2015-10-11 DIAGNOSIS — I110 Hypertensive heart disease with heart failure: Secondary | ICD-10-CM | POA: Diagnosis not present

## 2015-10-19 DIAGNOSIS — E7800 Pure hypercholesterolemia, unspecified: Secondary | ICD-10-CM | POA: Diagnosis not present

## 2015-10-19 DIAGNOSIS — Z950 Presence of cardiac pacemaker: Secondary | ICD-10-CM | POA: Diagnosis not present

## 2015-10-19 DIAGNOSIS — Z7982 Long term (current) use of aspirin: Secondary | ICD-10-CM | POA: Diagnosis not present

## 2015-10-19 DIAGNOSIS — H04123 Dry eye syndrome of bilateral lacrimal glands: Secondary | ICD-10-CM | POA: Diagnosis not present

## 2015-10-19 DIAGNOSIS — Z7951 Long term (current) use of inhaled steroids: Secondary | ICD-10-CM | POA: Diagnosis not present

## 2015-10-19 DIAGNOSIS — R0600 Dyspnea, unspecified: Secondary | ICD-10-CM | POA: Diagnosis not present

## 2015-10-19 DIAGNOSIS — Z881 Allergy status to other antibiotic agents status: Secondary | ICD-10-CM | POA: Diagnosis not present

## 2015-10-19 DIAGNOSIS — Z79899 Other long term (current) drug therapy: Secondary | ICD-10-CM | POA: Diagnosis not present

## 2015-10-19 DIAGNOSIS — Z882 Allergy status to sulfonamides status: Secondary | ICD-10-CM | POA: Diagnosis not present

## 2015-10-19 DIAGNOSIS — I10 Essential (primary) hypertension: Secondary | ICD-10-CM | POA: Diagnosis not present

## 2015-10-19 DIAGNOSIS — J449 Chronic obstructive pulmonary disease, unspecified: Secondary | ICD-10-CM | POA: Diagnosis not present

## 2015-10-19 DIAGNOSIS — Z885 Allergy status to narcotic agent status: Secondary | ICD-10-CM | POA: Diagnosis not present

## 2015-10-19 DIAGNOSIS — Z880 Allergy status to penicillin: Secondary | ICD-10-CM | POA: Diagnosis not present

## 2015-10-19 DIAGNOSIS — Z888 Allergy status to other drugs, medicaments and biological substances status: Secondary | ICD-10-CM | POA: Diagnosis not present

## 2015-10-19 DIAGNOSIS — I2510 Atherosclerotic heart disease of native coronary artery without angina pectoris: Secondary | ICD-10-CM | POA: Diagnosis not present

## 2015-10-25 DIAGNOSIS — I509 Heart failure, unspecified: Secondary | ICD-10-CM | POA: Diagnosis not present

## 2015-10-25 DIAGNOSIS — J449 Chronic obstructive pulmonary disease, unspecified: Secondary | ICD-10-CM | POA: Diagnosis not present

## 2015-10-25 DIAGNOSIS — I059 Rheumatic mitral valve disease, unspecified: Secondary | ICD-10-CM | POA: Diagnosis not present

## 2015-10-25 DIAGNOSIS — K5909 Other constipation: Secondary | ICD-10-CM | POA: Diagnosis not present

## 2015-10-25 DIAGNOSIS — I209 Angina pectoris, unspecified: Secondary | ICD-10-CM | POA: Diagnosis not present

## 2015-10-25 DIAGNOSIS — I110 Hypertensive heart disease with heart failure: Secondary | ICD-10-CM | POA: Diagnosis not present

## 2015-11-02 DIAGNOSIS — I110 Hypertensive heart disease with heart failure: Secondary | ICD-10-CM | POA: Diagnosis not present

## 2015-11-02 DIAGNOSIS — I209 Angina pectoris, unspecified: Secondary | ICD-10-CM | POA: Diagnosis not present

## 2015-11-02 DIAGNOSIS — K5909 Other constipation: Secondary | ICD-10-CM | POA: Diagnosis not present

## 2015-11-02 DIAGNOSIS — J449 Chronic obstructive pulmonary disease, unspecified: Secondary | ICD-10-CM | POA: Diagnosis not present

## 2015-11-02 DIAGNOSIS — I509 Heart failure, unspecified: Secondary | ICD-10-CM | POA: Diagnosis not present

## 2015-11-02 DIAGNOSIS — I059 Rheumatic mitral valve disease, unspecified: Secondary | ICD-10-CM | POA: Diagnosis not present

## 2015-11-05 DIAGNOSIS — K5909 Other constipation: Secondary | ICD-10-CM | POA: Diagnosis not present

## 2015-11-05 DIAGNOSIS — I059 Rheumatic mitral valve disease, unspecified: Secondary | ICD-10-CM | POA: Diagnosis not present

## 2015-11-05 DIAGNOSIS — H409 Unspecified glaucoma: Secondary | ICD-10-CM | POA: Diagnosis not present

## 2015-11-05 DIAGNOSIS — I209 Angina pectoris, unspecified: Secondary | ICD-10-CM | POA: Diagnosis not present

## 2015-11-05 DIAGNOSIS — M810 Age-related osteoporosis without current pathological fracture: Secondary | ICD-10-CM | POA: Diagnosis not present

## 2015-11-05 DIAGNOSIS — Z7982 Long term (current) use of aspirin: Secondary | ICD-10-CM | POA: Diagnosis not present

## 2015-11-05 DIAGNOSIS — M1991 Primary osteoarthritis, unspecified site: Secondary | ICD-10-CM | POA: Diagnosis not present

## 2015-11-05 DIAGNOSIS — I509 Heart failure, unspecified: Secondary | ICD-10-CM | POA: Diagnosis not present

## 2015-11-05 DIAGNOSIS — J449 Chronic obstructive pulmonary disease, unspecified: Secondary | ICD-10-CM | POA: Diagnosis not present

## 2015-11-05 DIAGNOSIS — I110 Hypertensive heart disease with heart failure: Secondary | ICD-10-CM | POA: Diagnosis not present

## 2015-11-05 DIAGNOSIS — G2581 Restless legs syndrome: Secondary | ICD-10-CM | POA: Diagnosis not present

## 2015-11-05 DIAGNOSIS — M419 Scoliosis, unspecified: Secondary | ICD-10-CM | POA: Diagnosis not present

## 2015-11-05 DIAGNOSIS — Z9181 History of falling: Secondary | ICD-10-CM | POA: Diagnosis not present

## 2015-11-05 DIAGNOSIS — H9193 Unspecified hearing loss, bilateral: Secondary | ICD-10-CM | POA: Diagnosis not present

## 2015-11-05 DIAGNOSIS — Z950 Presence of cardiac pacemaker: Secondary | ICD-10-CM | POA: Diagnosis not present

## 2015-11-09 DIAGNOSIS — I110 Hypertensive heart disease with heart failure: Secondary | ICD-10-CM | POA: Diagnosis not present

## 2015-11-09 DIAGNOSIS — K5909 Other constipation: Secondary | ICD-10-CM | POA: Diagnosis not present

## 2015-11-09 DIAGNOSIS — I209 Angina pectoris, unspecified: Secondary | ICD-10-CM | POA: Diagnosis not present

## 2015-11-09 DIAGNOSIS — J449 Chronic obstructive pulmonary disease, unspecified: Secondary | ICD-10-CM | POA: Diagnosis not present

## 2015-11-09 DIAGNOSIS — I509 Heart failure, unspecified: Secondary | ICD-10-CM | POA: Diagnosis not present

## 2015-11-09 DIAGNOSIS — I059 Rheumatic mitral valve disease, unspecified: Secondary | ICD-10-CM | POA: Diagnosis not present

## 2015-11-09 DIAGNOSIS — Z23 Encounter for immunization: Secondary | ICD-10-CM | POA: Diagnosis not present

## 2015-11-11 DIAGNOSIS — E559 Vitamin D deficiency, unspecified: Secondary | ICD-10-CM | POA: Diagnosis not present

## 2015-11-11 DIAGNOSIS — J449 Chronic obstructive pulmonary disease, unspecified: Secondary | ICD-10-CM | POA: Diagnosis not present

## 2015-11-11 DIAGNOSIS — Z6821 Body mass index (BMI) 21.0-21.9, adult: Secondary | ICD-10-CM | POA: Diagnosis not present

## 2015-11-11 DIAGNOSIS — I509 Heart failure, unspecified: Secondary | ICD-10-CM | POA: Diagnosis not present

## 2015-11-11 DIAGNOSIS — M1990 Unspecified osteoarthritis, unspecified site: Secondary | ICD-10-CM | POA: Diagnosis not present

## 2015-11-11 DIAGNOSIS — G4700 Insomnia, unspecified: Secondary | ICD-10-CM | POA: Diagnosis not present

## 2015-11-11 DIAGNOSIS — E039 Hypothyroidism, unspecified: Secondary | ICD-10-CM | POA: Diagnosis not present

## 2015-11-15 ENCOUNTER — Ambulatory Visit (INDEPENDENT_AMBULATORY_CARE_PROVIDER_SITE_OTHER): Payer: Medicare Other | Admitting: Otolaryngology

## 2015-11-15 DIAGNOSIS — J381 Polyp of vocal cord and larynx: Secondary | ICD-10-CM | POA: Diagnosis not present

## 2015-11-15 DIAGNOSIS — R490 Dysphonia: Secondary | ICD-10-CM

## 2015-11-16 DIAGNOSIS — I209 Angina pectoris, unspecified: Secondary | ICD-10-CM | POA: Diagnosis not present

## 2015-11-16 DIAGNOSIS — I110 Hypertensive heart disease with heart failure: Secondary | ICD-10-CM | POA: Diagnosis not present

## 2015-11-16 DIAGNOSIS — J449 Chronic obstructive pulmonary disease, unspecified: Secondary | ICD-10-CM | POA: Diagnosis not present

## 2015-11-16 DIAGNOSIS — K5909 Other constipation: Secondary | ICD-10-CM | POA: Diagnosis not present

## 2015-11-16 DIAGNOSIS — I059 Rheumatic mitral valve disease, unspecified: Secondary | ICD-10-CM | POA: Diagnosis not present

## 2015-11-16 DIAGNOSIS — I509 Heart failure, unspecified: Secondary | ICD-10-CM | POA: Diagnosis not present

## 2015-11-25 DIAGNOSIS — H04123 Dry eye syndrome of bilateral lacrimal glands: Secondary | ICD-10-CM | POA: Diagnosis not present

## 2015-11-30 DIAGNOSIS — J449 Chronic obstructive pulmonary disease, unspecified: Secondary | ICD-10-CM | POA: Diagnosis not present

## 2015-11-30 DIAGNOSIS — I209 Angina pectoris, unspecified: Secondary | ICD-10-CM | POA: Diagnosis not present

## 2015-11-30 DIAGNOSIS — I059 Rheumatic mitral valve disease, unspecified: Secondary | ICD-10-CM | POA: Diagnosis not present

## 2015-11-30 DIAGNOSIS — I509 Heart failure, unspecified: Secondary | ICD-10-CM | POA: Diagnosis not present

## 2015-11-30 DIAGNOSIS — I110 Hypertensive heart disease with heart failure: Secondary | ICD-10-CM | POA: Diagnosis not present

## 2015-11-30 DIAGNOSIS — K5909 Other constipation: Secondary | ICD-10-CM | POA: Diagnosis not present

## 2015-12-07 DIAGNOSIS — E039 Hypothyroidism, unspecified: Secondary | ICD-10-CM | POA: Diagnosis not present

## 2015-12-09 DIAGNOSIS — I509 Heart failure, unspecified: Secondary | ICD-10-CM | POA: Diagnosis not present

## 2015-12-09 DIAGNOSIS — I110 Hypertensive heart disease with heart failure: Secondary | ICD-10-CM | POA: Diagnosis not present

## 2015-12-09 DIAGNOSIS — J449 Chronic obstructive pulmonary disease, unspecified: Secondary | ICD-10-CM | POA: Diagnosis not present

## 2015-12-09 DIAGNOSIS — K5909 Other constipation: Secondary | ICD-10-CM | POA: Diagnosis not present

## 2015-12-09 DIAGNOSIS — I059 Rheumatic mitral valve disease, unspecified: Secondary | ICD-10-CM | POA: Diagnosis not present

## 2015-12-09 DIAGNOSIS — I209 Angina pectoris, unspecified: Secondary | ICD-10-CM | POA: Diagnosis not present

## 2015-12-10 DIAGNOSIS — R0682 Tachypnea, not elsewhere classified: Secondary | ICD-10-CM | POA: Diagnosis not present

## 2015-12-10 DIAGNOSIS — E860 Dehydration: Secondary | ICD-10-CM | POA: Diagnosis not present

## 2015-12-10 DIAGNOSIS — J9601 Acute respiratory failure with hypoxia: Secondary | ICD-10-CM | POA: Diagnosis not present

## 2015-12-10 DIAGNOSIS — Z6821 Body mass index (BMI) 21.0-21.9, adult: Secondary | ICD-10-CM | POA: Diagnosis not present

## 2015-12-10 DIAGNOSIS — I110 Hypertensive heart disease with heart failure: Secondary | ICD-10-CM | POA: Diagnosis not present

## 2015-12-10 DIAGNOSIS — E039 Hypothyroidism, unspecified: Secondary | ICD-10-CM | POA: Diagnosis not present

## 2015-12-10 DIAGNOSIS — R0602 Shortness of breath: Secondary | ICD-10-CM | POA: Diagnosis not present

## 2015-12-10 DIAGNOSIS — I9589 Other hypotension: Secondary | ICD-10-CM | POA: Diagnosis not present

## 2015-12-10 DIAGNOSIS — I5032 Chronic diastolic (congestive) heart failure: Secondary | ICD-10-CM | POA: Diagnosis not present

## 2015-12-10 DIAGNOSIS — K219 Gastro-esophageal reflux disease without esophagitis: Secondary | ICD-10-CM | POA: Diagnosis not present

## 2015-12-10 DIAGNOSIS — R509 Fever, unspecified: Secondary | ICD-10-CM | POA: Diagnosis not present

## 2015-12-10 DIAGNOSIS — R079 Chest pain, unspecified: Secondary | ICD-10-CM | POA: Diagnosis not present

## 2015-12-10 DIAGNOSIS — J181 Lobar pneumonia, unspecified organism: Secondary | ICD-10-CM | POA: Diagnosis not present

## 2015-12-10 DIAGNOSIS — A419 Sepsis, unspecified organism: Secondary | ICD-10-CM | POA: Diagnosis not present

## 2015-12-10 DIAGNOSIS — J441 Chronic obstructive pulmonary disease with (acute) exacerbation: Secondary | ICD-10-CM | POA: Diagnosis not present

## 2015-12-10 DIAGNOSIS — R490 Dysphonia: Secondary | ICD-10-CM | POA: Diagnosis not present

## 2015-12-11 DIAGNOSIS — K449 Diaphragmatic hernia without obstruction or gangrene: Secondary | ICD-10-CM | POA: Diagnosis present

## 2015-12-11 DIAGNOSIS — R0602 Shortness of breath: Secondary | ICD-10-CM | POA: Diagnosis not present

## 2015-12-11 DIAGNOSIS — J441 Chronic obstructive pulmonary disease with (acute) exacerbation: Secondary | ICD-10-CM | POA: Diagnosis present

## 2015-12-11 DIAGNOSIS — M810 Age-related osteoporosis without current pathological fracture: Secondary | ICD-10-CM | POA: Diagnosis present

## 2015-12-11 DIAGNOSIS — I5032 Chronic diastolic (congestive) heart failure: Secondary | ICD-10-CM | POA: Diagnosis present

## 2015-12-11 DIAGNOSIS — K219 Gastro-esophageal reflux disease without esophagitis: Secondary | ICD-10-CM | POA: Diagnosis present

## 2015-12-11 DIAGNOSIS — I509 Heart failure, unspecified: Secondary | ICD-10-CM | POA: Diagnosis not present

## 2015-12-11 DIAGNOSIS — J9601 Acute respiratory failure with hypoxia: Secondary | ICD-10-CM | POA: Diagnosis present

## 2015-12-11 DIAGNOSIS — J189 Pneumonia, unspecified organism: Secondary | ICD-10-CM | POA: Diagnosis not present

## 2015-12-11 DIAGNOSIS — K5909 Other constipation: Secondary | ICD-10-CM | POA: Diagnosis present

## 2015-12-11 DIAGNOSIS — I110 Hypertensive heart disease with heart failure: Secondary | ICD-10-CM | POA: Diagnosis present

## 2015-12-11 DIAGNOSIS — J90 Pleural effusion, not elsewhere classified: Secondary | ICD-10-CM | POA: Diagnosis not present

## 2015-12-11 DIAGNOSIS — Z950 Presence of cardiac pacemaker: Secondary | ICD-10-CM | POA: Diagnosis not present

## 2015-12-11 DIAGNOSIS — I2510 Atherosclerotic heart disease of native coronary artery without angina pectoris: Secondary | ICD-10-CM | POA: Diagnosis present

## 2015-12-11 DIAGNOSIS — R54 Age-related physical debility: Secondary | ICD-10-CM | POA: Diagnosis present

## 2015-12-11 DIAGNOSIS — G2581 Restless legs syndrome: Secondary | ICD-10-CM | POA: Diagnosis present

## 2015-12-11 DIAGNOSIS — E039 Hypothyroidism, unspecified: Secondary | ICD-10-CM | POA: Diagnosis present

## 2015-12-11 DIAGNOSIS — I517 Cardiomegaly: Secondary | ICD-10-CM | POA: Diagnosis present

## 2015-12-18 DIAGNOSIS — K219 Gastro-esophageal reflux disease without esophagitis: Secondary | ICD-10-CM | POA: Diagnosis not present

## 2015-12-18 DIAGNOSIS — E039 Hypothyroidism, unspecified: Secondary | ICD-10-CM | POA: Diagnosis not present

## 2015-12-18 DIAGNOSIS — D649 Anemia, unspecified: Secondary | ICD-10-CM | POA: Diagnosis not present

## 2015-12-18 DIAGNOSIS — G2581 Restless legs syndrome: Secondary | ICD-10-CM | POA: Diagnosis not present

## 2015-12-18 DIAGNOSIS — J181 Lobar pneumonia, unspecified organism: Secondary | ICD-10-CM | POA: Diagnosis not present

## 2015-12-18 DIAGNOSIS — R2689 Other abnormalities of gait and mobility: Secondary | ICD-10-CM | POA: Diagnosis not present

## 2015-12-18 DIAGNOSIS — J441 Chronic obstructive pulmonary disease with (acute) exacerbation: Secondary | ICD-10-CM | POA: Diagnosis not present

## 2015-12-18 DIAGNOSIS — I110 Hypertensive heart disease with heart failure: Secondary | ICD-10-CM | POA: Diagnosis not present

## 2015-12-18 DIAGNOSIS — I509 Heart failure, unspecified: Secondary | ICD-10-CM | POA: Diagnosis not present

## 2015-12-18 DIAGNOSIS — Z7982 Long term (current) use of aspirin: Secondary | ICD-10-CM | POA: Diagnosis not present

## 2015-12-18 DIAGNOSIS — M6281 Muscle weakness (generalized): Secondary | ICD-10-CM | POA: Diagnosis not present

## 2015-12-20 DIAGNOSIS — M6281 Muscle weakness (generalized): Secondary | ICD-10-CM | POA: Diagnosis not present

## 2015-12-20 DIAGNOSIS — I509 Heart failure, unspecified: Secondary | ICD-10-CM | POA: Diagnosis not present

## 2015-12-20 DIAGNOSIS — J181 Lobar pneumonia, unspecified organism: Secondary | ICD-10-CM | POA: Diagnosis not present

## 2015-12-20 DIAGNOSIS — I110 Hypertensive heart disease with heart failure: Secondary | ICD-10-CM | POA: Diagnosis not present

## 2015-12-20 DIAGNOSIS — R2689 Other abnormalities of gait and mobility: Secondary | ICD-10-CM | POA: Diagnosis not present

## 2015-12-20 DIAGNOSIS — J441 Chronic obstructive pulmonary disease with (acute) exacerbation: Secondary | ICD-10-CM | POA: Diagnosis not present

## 2015-12-22 DIAGNOSIS — B351 Tinea unguium: Secondary | ICD-10-CM | POA: Diagnosis not present

## 2015-12-22 DIAGNOSIS — M6281 Muscle weakness (generalized): Secondary | ICD-10-CM | POA: Diagnosis not present

## 2015-12-22 DIAGNOSIS — J441 Chronic obstructive pulmonary disease with (acute) exacerbation: Secondary | ICD-10-CM | POA: Diagnosis not present

## 2015-12-22 DIAGNOSIS — R2689 Other abnormalities of gait and mobility: Secondary | ICD-10-CM | POA: Diagnosis not present

## 2015-12-22 DIAGNOSIS — M79675 Pain in left toe(s): Secondary | ICD-10-CM | POA: Diagnosis not present

## 2015-12-22 DIAGNOSIS — I509 Heart failure, unspecified: Secondary | ICD-10-CM | POA: Diagnosis not present

## 2015-12-22 DIAGNOSIS — J181 Lobar pneumonia, unspecified organism: Secondary | ICD-10-CM | POA: Diagnosis not present

## 2015-12-22 DIAGNOSIS — I110 Hypertensive heart disease with heart failure: Secondary | ICD-10-CM | POA: Diagnosis not present

## 2015-12-24 DIAGNOSIS — J441 Chronic obstructive pulmonary disease with (acute) exacerbation: Secondary | ICD-10-CM | POA: Diagnosis not present

## 2015-12-24 DIAGNOSIS — I110 Hypertensive heart disease with heart failure: Secondary | ICD-10-CM | POA: Diagnosis not present

## 2015-12-24 DIAGNOSIS — I509 Heart failure, unspecified: Secondary | ICD-10-CM | POA: Diagnosis not present

## 2015-12-24 DIAGNOSIS — J181 Lobar pneumonia, unspecified organism: Secondary | ICD-10-CM | POA: Diagnosis not present

## 2015-12-24 DIAGNOSIS — M6281 Muscle weakness (generalized): Secondary | ICD-10-CM | POA: Diagnosis not present

## 2015-12-24 DIAGNOSIS — R2689 Other abnormalities of gait and mobility: Secondary | ICD-10-CM | POA: Diagnosis not present

## 2015-12-27 DIAGNOSIS — M6281 Muscle weakness (generalized): Secondary | ICD-10-CM | POA: Diagnosis not present

## 2015-12-27 DIAGNOSIS — J181 Lobar pneumonia, unspecified organism: Secondary | ICD-10-CM | POA: Diagnosis not present

## 2015-12-27 DIAGNOSIS — R2689 Other abnormalities of gait and mobility: Secondary | ICD-10-CM | POA: Diagnosis not present

## 2015-12-27 DIAGNOSIS — I509 Heart failure, unspecified: Secondary | ICD-10-CM | POA: Diagnosis not present

## 2015-12-27 DIAGNOSIS — J441 Chronic obstructive pulmonary disease with (acute) exacerbation: Secondary | ICD-10-CM | POA: Diagnosis not present

## 2015-12-27 DIAGNOSIS — I110 Hypertensive heart disease with heart failure: Secondary | ICD-10-CM | POA: Diagnosis not present

## 2015-12-28 DIAGNOSIS — M6281 Muscle weakness (generalized): Secondary | ICD-10-CM | POA: Diagnosis not present

## 2015-12-28 DIAGNOSIS — J441 Chronic obstructive pulmonary disease with (acute) exacerbation: Secondary | ICD-10-CM | POA: Diagnosis not present

## 2015-12-28 DIAGNOSIS — J181 Lobar pneumonia, unspecified organism: Secondary | ICD-10-CM | POA: Diagnosis not present

## 2015-12-28 DIAGNOSIS — I509 Heart failure, unspecified: Secondary | ICD-10-CM | POA: Diagnosis not present

## 2015-12-28 DIAGNOSIS — R2689 Other abnormalities of gait and mobility: Secondary | ICD-10-CM | POA: Diagnosis not present

## 2015-12-28 DIAGNOSIS — I110 Hypertensive heart disease with heart failure: Secondary | ICD-10-CM | POA: Diagnosis not present

## 2015-12-30 DIAGNOSIS — I110 Hypertensive heart disease with heart failure: Secondary | ICD-10-CM | POA: Diagnosis not present

## 2015-12-30 DIAGNOSIS — J441 Chronic obstructive pulmonary disease with (acute) exacerbation: Secondary | ICD-10-CM | POA: Diagnosis not present

## 2015-12-30 DIAGNOSIS — M6281 Muscle weakness (generalized): Secondary | ICD-10-CM | POA: Diagnosis not present

## 2015-12-30 DIAGNOSIS — J181 Lobar pneumonia, unspecified organism: Secondary | ICD-10-CM | POA: Diagnosis not present

## 2015-12-30 DIAGNOSIS — I509 Heart failure, unspecified: Secondary | ICD-10-CM | POA: Diagnosis not present

## 2015-12-30 DIAGNOSIS — R2689 Other abnormalities of gait and mobility: Secondary | ICD-10-CM | POA: Diagnosis not present

## 2015-12-31 DIAGNOSIS — M6281 Muscle weakness (generalized): Secondary | ICD-10-CM | POA: Diagnosis not present

## 2015-12-31 DIAGNOSIS — I509 Heart failure, unspecified: Secondary | ICD-10-CM | POA: Diagnosis not present

## 2015-12-31 DIAGNOSIS — J441 Chronic obstructive pulmonary disease with (acute) exacerbation: Secondary | ICD-10-CM | POA: Diagnosis not present

## 2015-12-31 DIAGNOSIS — R2689 Other abnormalities of gait and mobility: Secondary | ICD-10-CM | POA: Diagnosis not present

## 2015-12-31 DIAGNOSIS — J181 Lobar pneumonia, unspecified organism: Secondary | ICD-10-CM | POA: Diagnosis not present

## 2015-12-31 DIAGNOSIS — I110 Hypertensive heart disease with heart failure: Secondary | ICD-10-CM | POA: Diagnosis not present

## 2016-01-04 DIAGNOSIS — I509 Heart failure, unspecified: Secondary | ICD-10-CM | POA: Diagnosis not present

## 2016-01-04 DIAGNOSIS — I110 Hypertensive heart disease with heart failure: Secondary | ICD-10-CM | POA: Diagnosis not present

## 2016-01-04 DIAGNOSIS — R2689 Other abnormalities of gait and mobility: Secondary | ICD-10-CM | POA: Diagnosis not present

## 2016-01-04 DIAGNOSIS — J441 Chronic obstructive pulmonary disease with (acute) exacerbation: Secondary | ICD-10-CM | POA: Diagnosis not present

## 2016-01-04 DIAGNOSIS — J181 Lobar pneumonia, unspecified organism: Secondary | ICD-10-CM | POA: Diagnosis not present

## 2016-01-04 DIAGNOSIS — M6281 Muscle weakness (generalized): Secondary | ICD-10-CM | POA: Diagnosis not present

## 2016-01-05 DIAGNOSIS — I110 Hypertensive heart disease with heart failure: Secondary | ICD-10-CM | POA: Diagnosis not present

## 2016-01-05 DIAGNOSIS — M6281 Muscle weakness (generalized): Secondary | ICD-10-CM | POA: Diagnosis not present

## 2016-01-05 DIAGNOSIS — J181 Lobar pneumonia, unspecified organism: Secondary | ICD-10-CM | POA: Diagnosis not present

## 2016-01-05 DIAGNOSIS — I509 Heart failure, unspecified: Secondary | ICD-10-CM | POA: Diagnosis not present

## 2016-01-05 DIAGNOSIS — J441 Chronic obstructive pulmonary disease with (acute) exacerbation: Secondary | ICD-10-CM | POA: Diagnosis not present

## 2016-01-05 DIAGNOSIS — R2689 Other abnormalities of gait and mobility: Secondary | ICD-10-CM | POA: Diagnosis not present

## 2016-01-06 DIAGNOSIS — M6281 Muscle weakness (generalized): Secondary | ICD-10-CM | POA: Diagnosis not present

## 2016-01-06 DIAGNOSIS — J441 Chronic obstructive pulmonary disease with (acute) exacerbation: Secondary | ICD-10-CM | POA: Diagnosis not present

## 2016-01-06 DIAGNOSIS — I110 Hypertensive heart disease with heart failure: Secondary | ICD-10-CM | POA: Diagnosis not present

## 2016-01-06 DIAGNOSIS — J181 Lobar pneumonia, unspecified organism: Secondary | ICD-10-CM | POA: Diagnosis not present

## 2016-01-06 DIAGNOSIS — I509 Heart failure, unspecified: Secondary | ICD-10-CM | POA: Diagnosis not present

## 2016-01-06 DIAGNOSIS — R2689 Other abnormalities of gait and mobility: Secondary | ICD-10-CM | POA: Diagnosis not present

## 2016-01-07 DIAGNOSIS — I110 Hypertensive heart disease with heart failure: Secondary | ICD-10-CM | POA: Diagnosis not present

## 2016-01-07 DIAGNOSIS — J181 Lobar pneumonia, unspecified organism: Secondary | ICD-10-CM | POA: Diagnosis not present

## 2016-01-07 DIAGNOSIS — I509 Heart failure, unspecified: Secondary | ICD-10-CM | POA: Diagnosis not present

## 2016-01-07 DIAGNOSIS — J441 Chronic obstructive pulmonary disease with (acute) exacerbation: Secondary | ICD-10-CM | POA: Diagnosis not present

## 2016-01-07 DIAGNOSIS — R2689 Other abnormalities of gait and mobility: Secondary | ICD-10-CM | POA: Diagnosis not present

## 2016-01-07 DIAGNOSIS — M6281 Muscle weakness (generalized): Secondary | ICD-10-CM | POA: Diagnosis not present

## 2016-01-14 DIAGNOSIS — J181 Lobar pneumonia, unspecified organism: Secondary | ICD-10-CM | POA: Diagnosis not present

## 2016-01-14 DIAGNOSIS — Z6820 Body mass index (BMI) 20.0-20.9, adult: Secondary | ICD-10-CM | POA: Diagnosis not present

## 2016-01-18 DIAGNOSIS — I499 Cardiac arrhythmia, unspecified: Secondary | ICD-10-CM | POA: Diagnosis not present

## 2016-01-18 DIAGNOSIS — Z950 Presence of cardiac pacemaker: Secondary | ICD-10-CM | POA: Diagnosis not present

## 2016-01-18 DIAGNOSIS — I700 Atherosclerosis of aorta: Secondary | ICD-10-CM | POA: Diagnosis not present

## 2016-01-18 DIAGNOSIS — J181 Lobar pneumonia, unspecified organism: Secondary | ICD-10-CM | POA: Diagnosis not present

## 2016-01-18 DIAGNOSIS — R5383 Other fatigue: Secondary | ICD-10-CM | POA: Diagnosis not present

## 2016-01-18 DIAGNOSIS — J189 Pneumonia, unspecified organism: Secondary | ICD-10-CM | POA: Diagnosis not present

## 2016-01-18 DIAGNOSIS — I10 Essential (primary) hypertension: Secondary | ICD-10-CM | POA: Diagnosis not present

## 2016-01-18 DIAGNOSIS — M810 Age-related osteoporosis without current pathological fracture: Secondary | ICD-10-CM | POA: Diagnosis not present

## 2016-01-18 DIAGNOSIS — E7800 Pure hypercholesterolemia, unspecified: Secondary | ICD-10-CM | POA: Diagnosis not present

## 2016-01-19 DIAGNOSIS — K210 Gastro-esophageal reflux disease with esophagitis: Secondary | ICD-10-CM | POA: Diagnosis not present

## 2016-01-19 DIAGNOSIS — I10 Essential (primary) hypertension: Secondary | ICD-10-CM | POA: Diagnosis not present

## 2016-01-19 DIAGNOSIS — J449 Chronic obstructive pulmonary disease, unspecified: Secondary | ICD-10-CM | POA: Diagnosis not present

## 2016-01-19 DIAGNOSIS — H00011 Hordeolum externum right upper eyelid: Secondary | ICD-10-CM | POA: Diagnosis not present

## 2016-01-20 DIAGNOSIS — E039 Hypothyroidism, unspecified: Secondary | ICD-10-CM | POA: Diagnosis not present

## 2016-01-20 DIAGNOSIS — E7800 Pure hypercholesterolemia, unspecified: Secondary | ICD-10-CM | POA: Diagnosis not present

## 2016-01-20 DIAGNOSIS — Z950 Presence of cardiac pacemaker: Secondary | ICD-10-CM | POA: Diagnosis not present

## 2016-01-20 DIAGNOSIS — Z6820 Body mass index (BMI) 20.0-20.9, adult: Secondary | ICD-10-CM | POA: Diagnosis not present

## 2016-01-20 DIAGNOSIS — G2581 Restless legs syndrome: Secondary | ICD-10-CM | POA: Diagnosis not present

## 2016-01-20 DIAGNOSIS — M545 Low back pain: Secondary | ICD-10-CM | POA: Diagnosis not present

## 2016-01-26 DIAGNOSIS — E7800 Pure hypercholesterolemia, unspecified: Secondary | ICD-10-CM | POA: Diagnosis not present

## 2016-01-26 DIAGNOSIS — I10 Essential (primary) hypertension: Secondary | ICD-10-CM | POA: Diagnosis not present

## 2016-01-31 DIAGNOSIS — E860 Dehydration: Secondary | ICD-10-CM | POA: Diagnosis not present

## 2016-01-31 DIAGNOSIS — R279 Unspecified lack of coordination: Secondary | ICD-10-CM | POA: Diagnosis not present

## 2016-01-31 DIAGNOSIS — J440 Chronic obstructive pulmonary disease with acute lower respiratory infection: Secondary | ICD-10-CM | POA: Diagnosis not present

## 2016-01-31 DIAGNOSIS — J441 Chronic obstructive pulmonary disease with (acute) exacerbation: Secondary | ICD-10-CM | POA: Diagnosis not present

## 2016-01-31 DIAGNOSIS — Z743 Need for continuous supervision: Secondary | ICD-10-CM | POA: Diagnosis not present

## 2016-01-31 DIAGNOSIS — R0902 Hypoxemia: Secondary | ICD-10-CM | POA: Diagnosis not present

## 2016-01-31 DIAGNOSIS — R0602 Shortness of breath: Secondary | ICD-10-CM | POA: Diagnosis not present

## 2016-01-31 DIAGNOSIS — K219 Gastro-esophageal reflux disease without esophagitis: Secondary | ICD-10-CM | POA: Diagnosis not present

## 2016-01-31 DIAGNOSIS — R061 Stridor: Secondary | ICD-10-CM | POA: Diagnosis not present

## 2016-01-31 DIAGNOSIS — R05 Cough: Secondary | ICD-10-CM | POA: Diagnosis not present

## 2016-01-31 DIAGNOSIS — R062 Wheezing: Secondary | ICD-10-CM | POA: Diagnosis not present

## 2016-01-31 DIAGNOSIS — I110 Hypertensive heart disease with heart failure: Secondary | ICD-10-CM | POA: Diagnosis not present

## 2016-01-31 DIAGNOSIS — J209 Acute bronchitis, unspecified: Secondary | ICD-10-CM | POA: Diagnosis not present

## 2016-01-31 DIAGNOSIS — I5033 Acute on chronic diastolic (congestive) heart failure: Secondary | ICD-10-CM | POA: Diagnosis not present

## 2016-02-02 DIAGNOSIS — R2689 Other abnormalities of gait and mobility: Secondary | ICD-10-CM | POA: Diagnosis not present

## 2016-02-02 DIAGNOSIS — D649 Anemia, unspecified: Secondary | ICD-10-CM | POA: Diagnosis not present

## 2016-02-02 DIAGNOSIS — Z888 Allergy status to other drugs, medicaments and biological substances status: Secondary | ICD-10-CM | POA: Diagnosis not present

## 2016-02-02 DIAGNOSIS — G2581 Restless legs syndrome: Secondary | ICD-10-CM | POA: Diagnosis present

## 2016-02-02 DIAGNOSIS — Z885 Allergy status to narcotic agent status: Secondary | ICD-10-CM | POA: Diagnosis not present

## 2016-02-02 DIAGNOSIS — R5381 Other malaise: Secondary | ICD-10-CM | POA: Diagnosis present

## 2016-02-02 DIAGNOSIS — K219 Gastro-esophageal reflux disease without esophagitis: Secondary | ICD-10-CM | POA: Diagnosis present

## 2016-02-02 DIAGNOSIS — Z79899 Other long term (current) drug therapy: Secondary | ICD-10-CM | POA: Diagnosis not present

## 2016-02-02 DIAGNOSIS — Z880 Allergy status to penicillin: Secondary | ICD-10-CM | POA: Diagnosis not present

## 2016-02-02 DIAGNOSIS — R062 Wheezing: Secondary | ICD-10-CM | POA: Diagnosis not present

## 2016-02-02 DIAGNOSIS — E039 Hypothyroidism, unspecified: Secondary | ICD-10-CM | POA: Diagnosis not present

## 2016-02-02 DIAGNOSIS — J449 Chronic obstructive pulmonary disease, unspecified: Secondary | ICD-10-CM | POA: Diagnosis not present

## 2016-02-02 DIAGNOSIS — I110 Hypertensive heart disease with heart failure: Secondary | ICD-10-CM | POA: Diagnosis present

## 2016-02-02 DIAGNOSIS — J440 Chronic obstructive pulmonary disease with acute lower respiratory infection: Secondary | ICD-10-CM | POA: Diagnosis present

## 2016-02-02 DIAGNOSIS — J441 Chronic obstructive pulmonary disease with (acute) exacerbation: Secondary | ICD-10-CM | POA: Diagnosis not present

## 2016-02-02 DIAGNOSIS — I509 Heart failure, unspecified: Secondary | ICD-10-CM | POA: Diagnosis not present

## 2016-02-02 DIAGNOSIS — R42 Dizziness and giddiness: Secondary | ICD-10-CM | POA: Diagnosis not present

## 2016-02-02 DIAGNOSIS — H409 Unspecified glaucoma: Secondary | ICD-10-CM | POA: Diagnosis not present

## 2016-02-02 DIAGNOSIS — Z882 Allergy status to sulfonamides status: Secondary | ICD-10-CM | POA: Diagnosis not present

## 2016-02-02 DIAGNOSIS — R197 Diarrhea, unspecified: Secondary | ICD-10-CM | POA: Diagnosis present

## 2016-02-02 DIAGNOSIS — R0602 Shortness of breath: Secondary | ICD-10-CM | POA: Diagnosis not present

## 2016-02-02 DIAGNOSIS — R0682 Tachypnea, not elsewhere classified: Secondary | ICD-10-CM | POA: Diagnosis not present

## 2016-02-02 DIAGNOSIS — E860 Dehydration: Secondary | ICD-10-CM | POA: Diagnosis not present

## 2016-02-02 DIAGNOSIS — J189 Pneumonia, unspecified organism: Secondary | ICD-10-CM | POA: Diagnosis not present

## 2016-02-02 DIAGNOSIS — I5033 Acute on chronic diastolic (congestive) heart failure: Secondary | ICD-10-CM | POA: Diagnosis present

## 2016-02-02 DIAGNOSIS — M6281 Muscle weakness (generalized): Secondary | ICD-10-CM | POA: Diagnosis not present

## 2016-02-02 DIAGNOSIS — Z886 Allergy status to analgesic agent status: Secondary | ICD-10-CM | POA: Diagnosis not present

## 2016-02-02 DIAGNOSIS — Z7982 Long term (current) use of aspirin: Secondary | ICD-10-CM | POA: Diagnosis not present

## 2016-02-02 DIAGNOSIS — J209 Acute bronchitis, unspecified: Secondary | ICD-10-CM | POA: Diagnosis present

## 2016-02-02 DIAGNOSIS — I2510 Atherosclerotic heart disease of native coronary artery without angina pectoris: Secondary | ICD-10-CM | POA: Diagnosis present

## 2016-02-02 DIAGNOSIS — R05 Cough: Secondary | ICD-10-CM | POA: Diagnosis not present

## 2016-02-09 DIAGNOSIS — J449 Chronic obstructive pulmonary disease, unspecified: Secondary | ICD-10-CM | POA: Diagnosis not present

## 2016-02-09 DIAGNOSIS — K219 Gastro-esophageal reflux disease without esophagitis: Secondary | ICD-10-CM | POA: Diagnosis not present

## 2016-02-09 DIAGNOSIS — H409 Unspecified glaucoma: Secondary | ICD-10-CM | POA: Diagnosis not present

## 2016-02-09 DIAGNOSIS — J189 Pneumonia, unspecified organism: Secondary | ICD-10-CM | POA: Diagnosis not present

## 2016-02-09 DIAGNOSIS — J209 Acute bronchitis, unspecified: Secondary | ICD-10-CM | POA: Diagnosis not present

## 2016-02-09 DIAGNOSIS — R42 Dizziness and giddiness: Secondary | ICD-10-CM | POA: Diagnosis not present

## 2016-02-09 DIAGNOSIS — E039 Hypothyroidism, unspecified: Secondary | ICD-10-CM | POA: Diagnosis not present

## 2016-02-09 DIAGNOSIS — R2689 Other abnormalities of gait and mobility: Secondary | ICD-10-CM | POA: Diagnosis not present

## 2016-02-09 DIAGNOSIS — I110 Hypertensive heart disease with heart failure: Secondary | ICD-10-CM | POA: Diagnosis not present

## 2016-02-09 DIAGNOSIS — Z886 Allergy status to analgesic agent status: Secondary | ICD-10-CM | POA: Diagnosis not present

## 2016-02-09 DIAGNOSIS — Z888 Allergy status to other drugs, medicaments and biological substances status: Secondary | ICD-10-CM | POA: Diagnosis not present

## 2016-02-09 DIAGNOSIS — R5381 Other malaise: Secondary | ICD-10-CM | POA: Diagnosis not present

## 2016-02-09 DIAGNOSIS — D649 Anemia, unspecified: Secondary | ICD-10-CM | POA: Diagnosis not present

## 2016-02-09 DIAGNOSIS — I509 Heart failure, unspecified: Secondary | ICD-10-CM | POA: Diagnosis not present

## 2016-02-09 DIAGNOSIS — J441 Chronic obstructive pulmonary disease with (acute) exacerbation: Secondary | ICD-10-CM | POA: Diagnosis not present

## 2016-02-09 DIAGNOSIS — Z880 Allergy status to penicillin: Secondary | ICD-10-CM | POA: Diagnosis not present

## 2016-02-09 DIAGNOSIS — H04123 Dry eye syndrome of bilateral lacrimal glands: Secondary | ICD-10-CM | POA: Diagnosis not present

## 2016-02-09 DIAGNOSIS — J440 Chronic obstructive pulmonary disease with acute lower respiratory infection: Secondary | ICD-10-CM | POA: Diagnosis not present

## 2016-02-09 DIAGNOSIS — Z882 Allergy status to sulfonamides status: Secondary | ICD-10-CM | POA: Diagnosis not present

## 2016-02-09 DIAGNOSIS — M6281 Muscle weakness (generalized): Secondary | ICD-10-CM | POA: Diagnosis not present

## 2016-03-05 DIAGNOSIS — J449 Chronic obstructive pulmonary disease, unspecified: Secondary | ICD-10-CM | POA: Diagnosis not present

## 2016-03-08 DIAGNOSIS — H04123 Dry eye syndrome of bilateral lacrimal glands: Secondary | ICD-10-CM | POA: Diagnosis not present

## 2016-03-13 DIAGNOSIS — I509 Heart failure, unspecified: Secondary | ICD-10-CM | POA: Diagnosis not present

## 2016-03-13 DIAGNOSIS — Z950 Presence of cardiac pacemaker: Secondary | ICD-10-CM | POA: Diagnosis not present

## 2016-03-13 DIAGNOSIS — I110 Hypertensive heart disease with heart failure: Secondary | ICD-10-CM | POA: Diagnosis not present

## 2016-03-13 DIAGNOSIS — Z8701 Personal history of pneumonia (recurrent): Secondary | ICD-10-CM | POA: Diagnosis not present

## 2016-03-13 DIAGNOSIS — R262 Difficulty in walking, not elsewhere classified: Secondary | ICD-10-CM | POA: Diagnosis not present

## 2016-03-13 DIAGNOSIS — J441 Chronic obstructive pulmonary disease with (acute) exacerbation: Secondary | ICD-10-CM | POA: Diagnosis not present

## 2016-03-13 DIAGNOSIS — Z9981 Dependence on supplemental oxygen: Secondary | ICD-10-CM | POA: Diagnosis not present

## 2016-03-15 DIAGNOSIS — J441 Chronic obstructive pulmonary disease with (acute) exacerbation: Secondary | ICD-10-CM | POA: Diagnosis not present

## 2016-03-15 DIAGNOSIS — Z950 Presence of cardiac pacemaker: Secondary | ICD-10-CM | POA: Diagnosis not present

## 2016-03-15 DIAGNOSIS — I110 Hypertensive heart disease with heart failure: Secondary | ICD-10-CM | POA: Diagnosis not present

## 2016-03-15 DIAGNOSIS — Z9981 Dependence on supplemental oxygen: Secondary | ICD-10-CM | POA: Diagnosis not present

## 2016-03-15 DIAGNOSIS — I509 Heart failure, unspecified: Secondary | ICD-10-CM | POA: Diagnosis not present

## 2016-03-15 DIAGNOSIS — R262 Difficulty in walking, not elsewhere classified: Secondary | ICD-10-CM | POA: Diagnosis not present

## 2016-03-16 DIAGNOSIS — I509 Heart failure, unspecified: Secondary | ICD-10-CM | POA: Diagnosis not present

## 2016-03-16 DIAGNOSIS — Z950 Presence of cardiac pacemaker: Secondary | ICD-10-CM | POA: Diagnosis not present

## 2016-03-16 DIAGNOSIS — G2581 Restless legs syndrome: Secondary | ICD-10-CM | POA: Diagnosis not present

## 2016-03-16 DIAGNOSIS — Z6820 Body mass index (BMI) 20.0-20.9, adult: Secondary | ICD-10-CM | POA: Diagnosis not present

## 2016-03-16 DIAGNOSIS — J449 Chronic obstructive pulmonary disease, unspecified: Secondary | ICD-10-CM | POA: Diagnosis not present

## 2016-03-21 DIAGNOSIS — M2570 Osteophyte, unspecified joint: Secondary | ICD-10-CM | POA: Diagnosis not present

## 2016-03-21 DIAGNOSIS — M79672 Pain in left foot: Secondary | ICD-10-CM | POA: Diagnosis not present

## 2016-03-22 DIAGNOSIS — Z950 Presence of cardiac pacemaker: Secondary | ICD-10-CM | POA: Diagnosis not present

## 2016-03-22 DIAGNOSIS — R262 Difficulty in walking, not elsewhere classified: Secondary | ICD-10-CM | POA: Diagnosis not present

## 2016-03-22 DIAGNOSIS — I110 Hypertensive heart disease with heart failure: Secondary | ICD-10-CM | POA: Diagnosis not present

## 2016-03-22 DIAGNOSIS — J441 Chronic obstructive pulmonary disease with (acute) exacerbation: Secondary | ICD-10-CM | POA: Diagnosis not present

## 2016-03-22 DIAGNOSIS — I509 Heart failure, unspecified: Secondary | ICD-10-CM | POA: Diagnosis not present

## 2016-03-22 DIAGNOSIS — Z9981 Dependence on supplemental oxygen: Secondary | ICD-10-CM | POA: Diagnosis not present

## 2016-03-24 DIAGNOSIS — J441 Chronic obstructive pulmonary disease with (acute) exacerbation: Secondary | ICD-10-CM | POA: Diagnosis not present

## 2016-03-24 DIAGNOSIS — Z9981 Dependence on supplemental oxygen: Secondary | ICD-10-CM | POA: Diagnosis not present

## 2016-03-24 DIAGNOSIS — I509 Heart failure, unspecified: Secondary | ICD-10-CM | POA: Diagnosis not present

## 2016-03-24 DIAGNOSIS — Z950 Presence of cardiac pacemaker: Secondary | ICD-10-CM | POA: Diagnosis not present

## 2016-03-24 DIAGNOSIS — I110 Hypertensive heart disease with heart failure: Secondary | ICD-10-CM | POA: Diagnosis not present

## 2016-03-24 DIAGNOSIS — R262 Difficulty in walking, not elsewhere classified: Secondary | ICD-10-CM | POA: Diagnosis not present

## 2016-03-27 DIAGNOSIS — I509 Heart failure, unspecified: Secondary | ICD-10-CM | POA: Diagnosis not present

## 2016-03-27 DIAGNOSIS — R262 Difficulty in walking, not elsewhere classified: Secondary | ICD-10-CM | POA: Diagnosis not present

## 2016-03-27 DIAGNOSIS — Z9981 Dependence on supplemental oxygen: Secondary | ICD-10-CM | POA: Diagnosis not present

## 2016-03-27 DIAGNOSIS — I110 Hypertensive heart disease with heart failure: Secondary | ICD-10-CM | POA: Diagnosis not present

## 2016-03-27 DIAGNOSIS — Z950 Presence of cardiac pacemaker: Secondary | ICD-10-CM | POA: Diagnosis not present

## 2016-03-27 DIAGNOSIS — J441 Chronic obstructive pulmonary disease with (acute) exacerbation: Secondary | ICD-10-CM | POA: Diagnosis not present

## 2016-03-30 DIAGNOSIS — I509 Heart failure, unspecified: Secondary | ICD-10-CM | POA: Diagnosis not present

## 2016-03-30 DIAGNOSIS — Z9981 Dependence on supplemental oxygen: Secondary | ICD-10-CM | POA: Diagnosis not present

## 2016-03-30 DIAGNOSIS — Z950 Presence of cardiac pacemaker: Secondary | ICD-10-CM | POA: Diagnosis not present

## 2016-03-30 DIAGNOSIS — R262 Difficulty in walking, not elsewhere classified: Secondary | ICD-10-CM | POA: Diagnosis not present

## 2016-03-30 DIAGNOSIS — I110 Hypertensive heart disease with heart failure: Secondary | ICD-10-CM | POA: Diagnosis not present

## 2016-03-30 DIAGNOSIS — J441 Chronic obstructive pulmonary disease with (acute) exacerbation: Secondary | ICD-10-CM | POA: Diagnosis not present

## 2016-03-31 DIAGNOSIS — G4700 Insomnia, unspecified: Secondary | ICD-10-CM | POA: Diagnosis not present

## 2016-03-31 DIAGNOSIS — E039 Hypothyroidism, unspecified: Secondary | ICD-10-CM | POA: Diagnosis not present

## 2016-03-31 DIAGNOSIS — J449 Chronic obstructive pulmonary disease, unspecified: Secondary | ICD-10-CM | POA: Diagnosis not present

## 2016-03-31 DIAGNOSIS — Z6821 Body mass index (BMI) 21.0-21.9, adult: Secondary | ICD-10-CM | POA: Diagnosis not present

## 2016-04-03 ENCOUNTER — Ambulatory Visit (INDEPENDENT_AMBULATORY_CARE_PROVIDER_SITE_OTHER): Payer: Medicare Other | Admitting: Otolaryngology

## 2016-04-03 DIAGNOSIS — J382 Nodules of vocal cords: Secondary | ICD-10-CM

## 2016-04-03 DIAGNOSIS — R490 Dysphonia: Secondary | ICD-10-CM | POA: Diagnosis not present

## 2016-04-11 DIAGNOSIS — M4150 Other secondary scoliosis, site unspecified: Secondary | ICD-10-CM | POA: Diagnosis not present

## 2016-04-11 DIAGNOSIS — M1711 Unilateral primary osteoarthritis, right knee: Secondary | ICD-10-CM | POA: Diagnosis not present

## 2016-04-11 DIAGNOSIS — M25561 Pain in right knee: Secondary | ICD-10-CM | POA: Diagnosis not present

## 2016-04-13 DIAGNOSIS — M79675 Pain in left toe(s): Secondary | ICD-10-CM | POA: Diagnosis not present

## 2016-04-13 DIAGNOSIS — M79672 Pain in left foot: Secondary | ICD-10-CM | POA: Diagnosis not present

## 2016-04-13 DIAGNOSIS — B351 Tinea unguium: Secondary | ICD-10-CM | POA: Diagnosis not present

## 2016-04-17 DIAGNOSIS — H04123 Dry eye syndrome of bilateral lacrimal glands: Secondary | ICD-10-CM | POA: Diagnosis not present

## 2016-04-17 DIAGNOSIS — H401133 Primary open-angle glaucoma, bilateral, severe stage: Secondary | ICD-10-CM | POA: Diagnosis not present

## 2016-04-20 DIAGNOSIS — H401131 Primary open-angle glaucoma, bilateral, mild stage: Secondary | ICD-10-CM | POA: Diagnosis not present

## 2016-04-20 DIAGNOSIS — Z961 Presence of intraocular lens: Secondary | ICD-10-CM | POA: Diagnosis not present

## 2016-04-21 DIAGNOSIS — Z7951 Long term (current) use of inhaled steroids: Secondary | ICD-10-CM | POA: Diagnosis not present

## 2016-04-21 DIAGNOSIS — I509 Heart failure, unspecified: Secondary | ICD-10-CM | POA: Diagnosis not present

## 2016-04-21 DIAGNOSIS — I110 Hypertensive heart disease with heart failure: Secondary | ICD-10-CM | POA: Diagnosis not present

## 2016-04-21 DIAGNOSIS — R0602 Shortness of breath: Secondary | ICD-10-CM | POA: Diagnosis not present

## 2016-04-21 DIAGNOSIS — H409 Unspecified glaucoma: Secondary | ICD-10-CM | POA: Diagnosis not present

## 2016-04-21 DIAGNOSIS — M810 Age-related osteoporosis without current pathological fracture: Secondary | ICD-10-CM | POA: Diagnosis not present

## 2016-04-21 DIAGNOSIS — Z950 Presence of cardiac pacemaker: Secondary | ICD-10-CM | POA: Diagnosis not present

## 2016-04-21 DIAGNOSIS — Z7982 Long term (current) use of aspirin: Secondary | ICD-10-CM | POA: Diagnosis not present

## 2016-04-21 DIAGNOSIS — K625 Hemorrhage of anus and rectum: Secondary | ICD-10-CM | POA: Diagnosis not present

## 2016-04-21 DIAGNOSIS — G2581 Restless legs syndrome: Secondary | ICD-10-CM | POA: Diagnosis not present

## 2016-04-21 DIAGNOSIS — Z79899 Other long term (current) drug therapy: Secondary | ICD-10-CM | POA: Diagnosis not present

## 2016-04-21 DIAGNOSIS — J441 Chronic obstructive pulmonary disease with (acute) exacerbation: Secondary | ICD-10-CM | POA: Diagnosis not present

## 2016-04-24 DIAGNOSIS — R1314 Dysphagia, pharyngoesophageal phase: Secondary | ICD-10-CM | POA: Diagnosis not present

## 2016-04-24 DIAGNOSIS — J387 Other diseases of larynx: Secondary | ICD-10-CM | POA: Diagnosis not present

## 2016-04-24 DIAGNOSIS — J384 Edema of larynx: Secondary | ICD-10-CM | POA: Diagnosis not present

## 2016-04-24 DIAGNOSIS — R490 Dysphonia: Secondary | ICD-10-CM | POA: Diagnosis not present

## 2016-05-09 DIAGNOSIS — R1310 Dysphagia, unspecified: Secondary | ICD-10-CM | POA: Diagnosis not present

## 2016-05-10 DIAGNOSIS — R3915 Urgency of urination: Secondary | ICD-10-CM | POA: Diagnosis not present

## 2016-05-10 DIAGNOSIS — M25561 Pain in right knee: Secondary | ICD-10-CM | POA: Diagnosis not present

## 2016-05-10 DIAGNOSIS — L219 Seborrheic dermatitis, unspecified: Secondary | ICD-10-CM | POA: Diagnosis not present

## 2016-05-10 DIAGNOSIS — Z6820 Body mass index (BMI) 20.0-20.9, adult: Secondary | ICD-10-CM | POA: Diagnosis not present

## 2016-05-26 DIAGNOSIS — E039 Hypothyroidism, unspecified: Secondary | ICD-10-CM | POA: Diagnosis not present

## 2016-05-26 DIAGNOSIS — M545 Low back pain: Secondary | ICD-10-CM | POA: Diagnosis not present

## 2016-05-26 DIAGNOSIS — Z6820 Body mass index (BMI) 20.0-20.9, adult: Secondary | ICD-10-CM | POA: Diagnosis not present

## 2016-05-26 DIAGNOSIS — M353 Polymyalgia rheumatica: Secondary | ICD-10-CM | POA: Diagnosis not present

## 2016-06-08 DIAGNOSIS — R1310 Dysphagia, unspecified: Secondary | ICD-10-CM | POA: Diagnosis not present

## 2016-06-09 DIAGNOSIS — E7800 Pure hypercholesterolemia, unspecified: Secondary | ICD-10-CM | POA: Diagnosis not present

## 2016-06-09 DIAGNOSIS — J449 Chronic obstructive pulmonary disease, unspecified: Secondary | ICD-10-CM | POA: Diagnosis not present

## 2016-06-09 DIAGNOSIS — I10 Essential (primary) hypertension: Secondary | ICD-10-CM | POA: Diagnosis not present

## 2016-06-09 DIAGNOSIS — E039 Hypothyroidism, unspecified: Secondary | ICD-10-CM | POA: Diagnosis not present

## 2016-06-15 DIAGNOSIS — D649 Anemia, unspecified: Secondary | ICD-10-CM | POA: Diagnosis not present

## 2016-06-15 DIAGNOSIS — Z6820 Body mass index (BMI) 20.0-20.9, adult: Secondary | ICD-10-CM | POA: Diagnosis not present

## 2016-06-15 DIAGNOSIS — G2581 Restless legs syndrome: Secondary | ICD-10-CM | POA: Diagnosis not present

## 2016-06-17 DIAGNOSIS — D519 Vitamin B12 deficiency anemia, unspecified: Secondary | ICD-10-CM | POA: Diagnosis not present

## 2016-06-17 DIAGNOSIS — Z6820 Body mass index (BMI) 20.0-20.9, adult: Secondary | ICD-10-CM | POA: Diagnosis not present

## 2016-06-17 DIAGNOSIS — G2581 Restless legs syndrome: Secondary | ICD-10-CM | POA: Diagnosis not present

## 2016-06-17 DIAGNOSIS — D649 Anemia, unspecified: Secondary | ICD-10-CM | POA: Diagnosis not present

## 2016-06-23 DIAGNOSIS — E039 Hypothyroidism, unspecified: Secondary | ICD-10-CM | POA: Diagnosis not present

## 2016-06-23 DIAGNOSIS — G629 Polyneuropathy, unspecified: Secondary | ICD-10-CM | POA: Diagnosis not present

## 2016-06-23 DIAGNOSIS — M25561 Pain in right knee: Secondary | ICD-10-CM | POA: Diagnosis not present

## 2016-06-23 DIAGNOSIS — Z6820 Body mass index (BMI) 20.0-20.9, adult: Secondary | ICD-10-CM | POA: Diagnosis not present

## 2016-06-23 DIAGNOSIS — G2581 Restless legs syndrome: Secondary | ICD-10-CM | POA: Diagnosis not present

## 2016-07-04 DIAGNOSIS — I442 Atrioventricular block, complete: Secondary | ICD-10-CM | POA: Diagnosis not present

## 2016-07-04 DIAGNOSIS — Z45010 Encounter for checking and testing of cardiac pacemaker pulse generator [battery]: Secondary | ICD-10-CM | POA: Diagnosis not present

## 2016-07-25 DIAGNOSIS — D649 Anemia, unspecified: Secondary | ICD-10-CM | POA: Diagnosis not present

## 2016-07-25 DIAGNOSIS — D519 Vitamin B12 deficiency anemia, unspecified: Secondary | ICD-10-CM | POA: Diagnosis not present

## 2016-07-26 DIAGNOSIS — M1990 Unspecified osteoarthritis, unspecified site: Secondary | ICD-10-CM | POA: Diagnosis not present

## 2016-07-26 DIAGNOSIS — Z950 Presence of cardiac pacemaker: Secondary | ICD-10-CM | POA: Diagnosis not present

## 2016-07-26 DIAGNOSIS — Z6820 Body mass index (BMI) 20.0-20.9, adult: Secondary | ICD-10-CM | POA: Diagnosis not present

## 2016-07-26 DIAGNOSIS — I509 Heart failure, unspecified: Secondary | ICD-10-CM | POA: Diagnosis not present

## 2016-07-26 DIAGNOSIS — D649 Anemia, unspecified: Secondary | ICD-10-CM | POA: Diagnosis not present

## 2016-07-26 DIAGNOSIS — G2581 Restless legs syndrome: Secondary | ICD-10-CM | POA: Diagnosis not present

## 2016-08-01 DIAGNOSIS — I10 Essential (primary) hypertension: Secondary | ICD-10-CM | POA: Diagnosis not present

## 2016-08-01 DIAGNOSIS — I2510 Atherosclerotic heart disease of native coronary artery without angina pectoris: Secondary | ICD-10-CM | POA: Diagnosis not present

## 2016-08-14 DIAGNOSIS — F419 Anxiety disorder, unspecified: Secondary | ICD-10-CM | POA: Diagnosis not present

## 2016-08-14 DIAGNOSIS — H04123 Dry eye syndrome of bilateral lacrimal glands: Secondary | ICD-10-CM | POA: Diagnosis not present

## 2016-08-14 DIAGNOSIS — I10 Essential (primary) hypertension: Secondary | ICD-10-CM | POA: Diagnosis not present

## 2016-08-14 DIAGNOSIS — H401133 Primary open-angle glaucoma, bilateral, severe stage: Secondary | ICD-10-CM | POA: Diagnosis not present

## 2016-08-14 DIAGNOSIS — Z961 Presence of intraocular lens: Secondary | ICD-10-CM | POA: Diagnosis not present

## 2016-08-14 DIAGNOSIS — J449 Chronic obstructive pulmonary disease, unspecified: Secondary | ICD-10-CM | POA: Diagnosis not present

## 2016-08-14 DIAGNOSIS — H01009 Unspecified blepharitis unspecified eye, unspecified eyelid: Secondary | ICD-10-CM | POA: Diagnosis not present

## 2016-08-14 DIAGNOSIS — J301 Allergic rhinitis due to pollen: Secondary | ICD-10-CM | POA: Diagnosis not present

## 2016-10-05 DIAGNOSIS — Z4509 Encounter for adjustment and management of other cardiac device: Secondary | ICD-10-CM | POA: Diagnosis not present

## 2016-10-05 DIAGNOSIS — I442 Atrioventricular block, complete: Secondary | ICD-10-CM | POA: Diagnosis not present

## 2016-10-17 DIAGNOSIS — Z23 Encounter for immunization: Secondary | ICD-10-CM | POA: Diagnosis not present

## 2016-10-17 DIAGNOSIS — R270 Ataxia, unspecified: Secondary | ICD-10-CM | POA: Diagnosis not present

## 2016-10-17 DIAGNOSIS — Z6820 Body mass index (BMI) 20.0-20.9, adult: Secondary | ICD-10-CM | POA: Diagnosis not present

## 2016-10-18 DIAGNOSIS — I110 Hypertensive heart disease with heart failure: Secondary | ICD-10-CM | POA: Diagnosis not present

## 2016-10-18 DIAGNOSIS — G319 Degenerative disease of nervous system, unspecified: Secondary | ICD-10-CM | POA: Diagnosis not present

## 2016-10-18 DIAGNOSIS — M419 Scoliosis, unspecified: Secondary | ICD-10-CM | POA: Diagnosis not present

## 2016-10-18 DIAGNOSIS — Z7982 Long term (current) use of aspirin: Secondary | ICD-10-CM | POA: Diagnosis not present

## 2016-10-18 DIAGNOSIS — E039 Hypothyroidism, unspecified: Secondary | ICD-10-CM | POA: Diagnosis not present

## 2016-10-18 DIAGNOSIS — G9009 Other idiopathic peripheral autonomic neuropathy: Secondary | ICD-10-CM | POA: Diagnosis not present

## 2016-10-18 DIAGNOSIS — Z8701 Personal history of pneumonia (recurrent): Secondary | ICD-10-CM | POA: Diagnosis not present

## 2016-10-18 DIAGNOSIS — R278 Other lack of coordination: Secondary | ICD-10-CM | POA: Diagnosis not present

## 2016-10-18 DIAGNOSIS — I509 Heart failure, unspecified: Secondary | ICD-10-CM | POA: Diagnosis not present

## 2016-10-18 DIAGNOSIS — F0280 Dementia in other diseases classified elsewhere without behavioral disturbance: Secondary | ICD-10-CM | POA: Diagnosis not present

## 2016-10-18 DIAGNOSIS — Z87440 Personal history of urinary (tract) infections: Secondary | ICD-10-CM | POA: Diagnosis not present

## 2016-10-18 DIAGNOSIS — J449 Chronic obstructive pulmonary disease, unspecified: Secondary | ICD-10-CM | POA: Diagnosis not present

## 2016-10-19 DIAGNOSIS — H04123 Dry eye syndrome of bilateral lacrimal glands: Secondary | ICD-10-CM | POA: Diagnosis not present

## 2016-10-20 DIAGNOSIS — M419 Scoliosis, unspecified: Secondary | ICD-10-CM | POA: Diagnosis not present

## 2016-10-20 DIAGNOSIS — G9009 Other idiopathic peripheral autonomic neuropathy: Secondary | ICD-10-CM | POA: Diagnosis not present

## 2016-10-20 DIAGNOSIS — G319 Degenerative disease of nervous system, unspecified: Secondary | ICD-10-CM | POA: Diagnosis not present

## 2016-10-20 DIAGNOSIS — I110 Hypertensive heart disease with heart failure: Secondary | ICD-10-CM | POA: Diagnosis not present

## 2016-10-20 DIAGNOSIS — F0280 Dementia in other diseases classified elsewhere without behavioral disturbance: Secondary | ICD-10-CM | POA: Diagnosis not present

## 2016-10-20 DIAGNOSIS — R278 Other lack of coordination: Secondary | ICD-10-CM | POA: Diagnosis not present

## 2016-10-23 DIAGNOSIS — M419 Scoliosis, unspecified: Secondary | ICD-10-CM | POA: Diagnosis not present

## 2016-10-23 DIAGNOSIS — G9009 Other idiopathic peripheral autonomic neuropathy: Secondary | ICD-10-CM | POA: Diagnosis not present

## 2016-10-23 DIAGNOSIS — I110 Hypertensive heart disease with heart failure: Secondary | ICD-10-CM | POA: Diagnosis not present

## 2016-10-23 DIAGNOSIS — F0280 Dementia in other diseases classified elsewhere without behavioral disturbance: Secondary | ICD-10-CM | POA: Diagnosis not present

## 2016-10-23 DIAGNOSIS — R278 Other lack of coordination: Secondary | ICD-10-CM | POA: Diagnosis not present

## 2016-10-23 DIAGNOSIS — G319 Degenerative disease of nervous system, unspecified: Secondary | ICD-10-CM | POA: Diagnosis not present

## 2016-10-24 IMAGING — CR DG HIP (WITH OR WITHOUT PELVIS) 2-3V*R*
2 series · 2 of 2 positions shown · non-contrast
Comparison: None.

CLINICAL DATA: Hx of RA, pain for two weeks with Bearing weight or
turning over in the bed, no injury not [REDACTED]5 films taken
weight bearing

EXAM:
DG HIP (WITH OR WITHOUT PELVIS) 2-3V RIGHT

[w hip ap right]
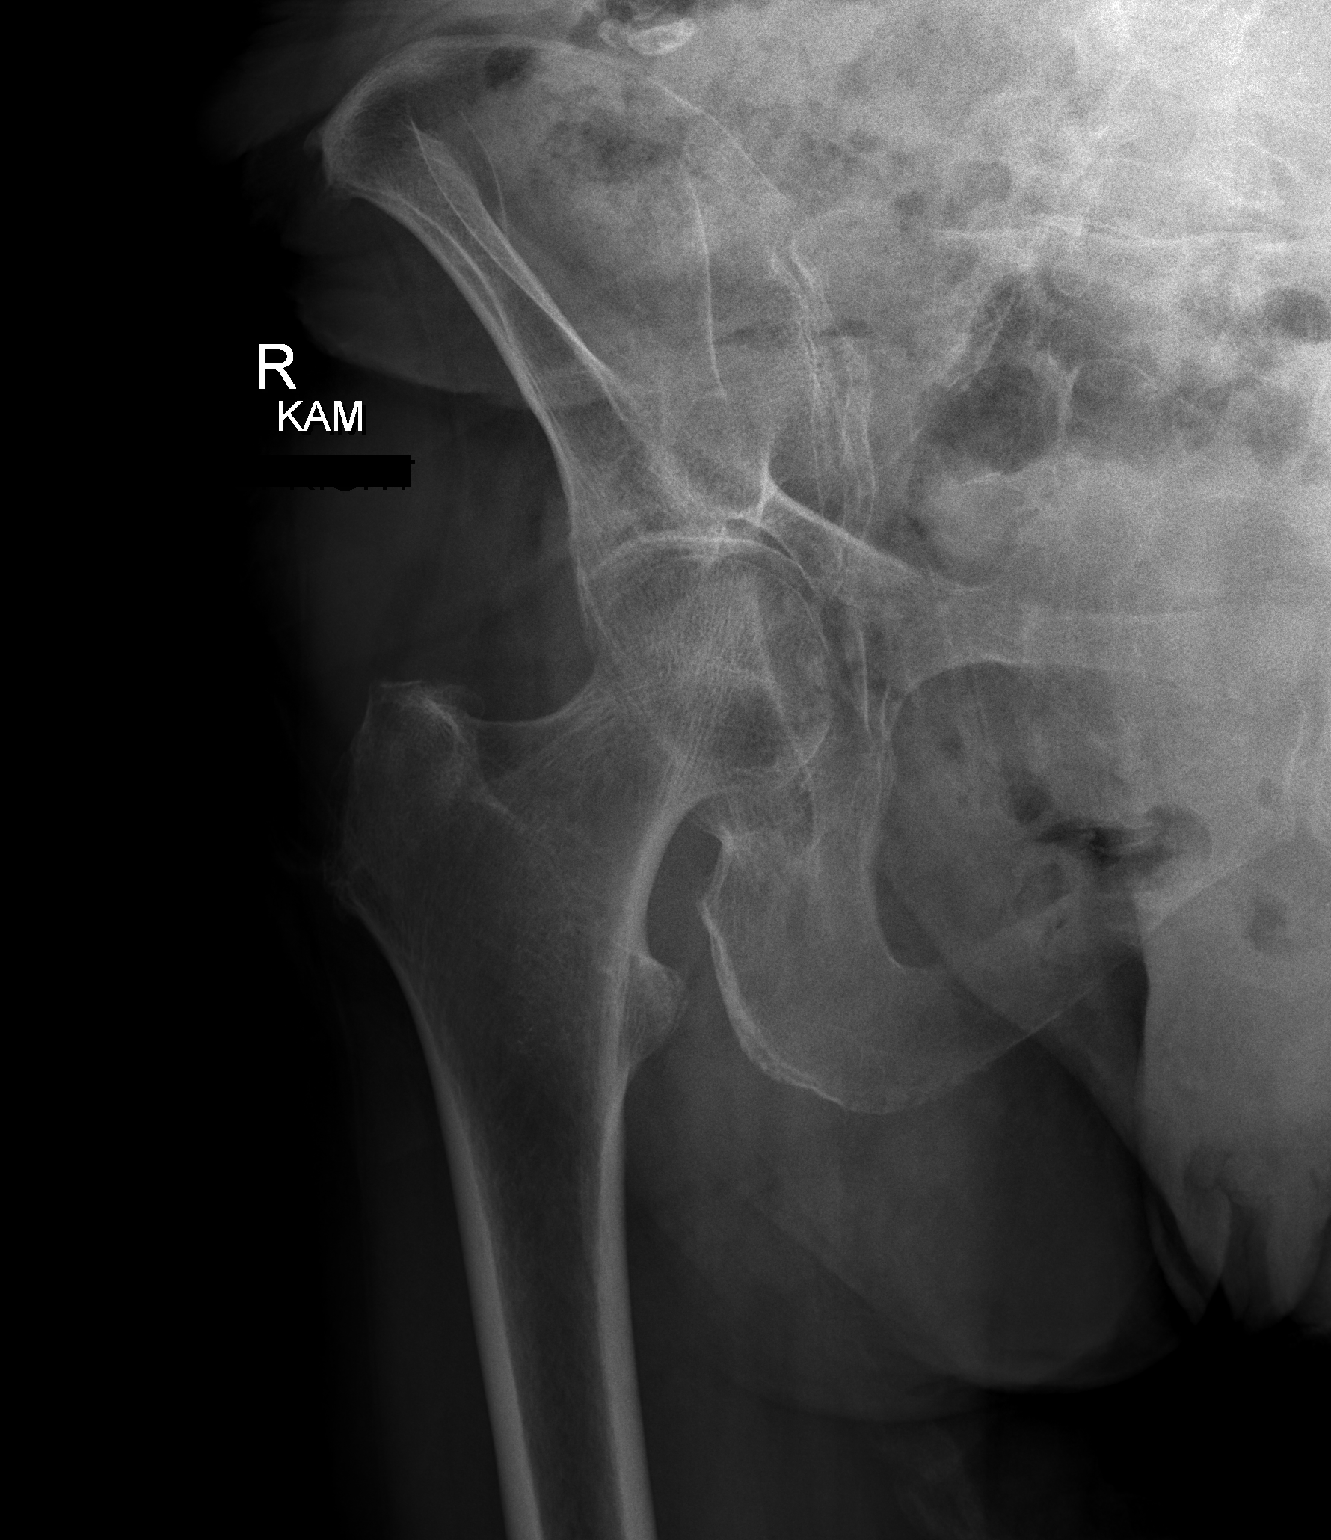

[w hip frog right]
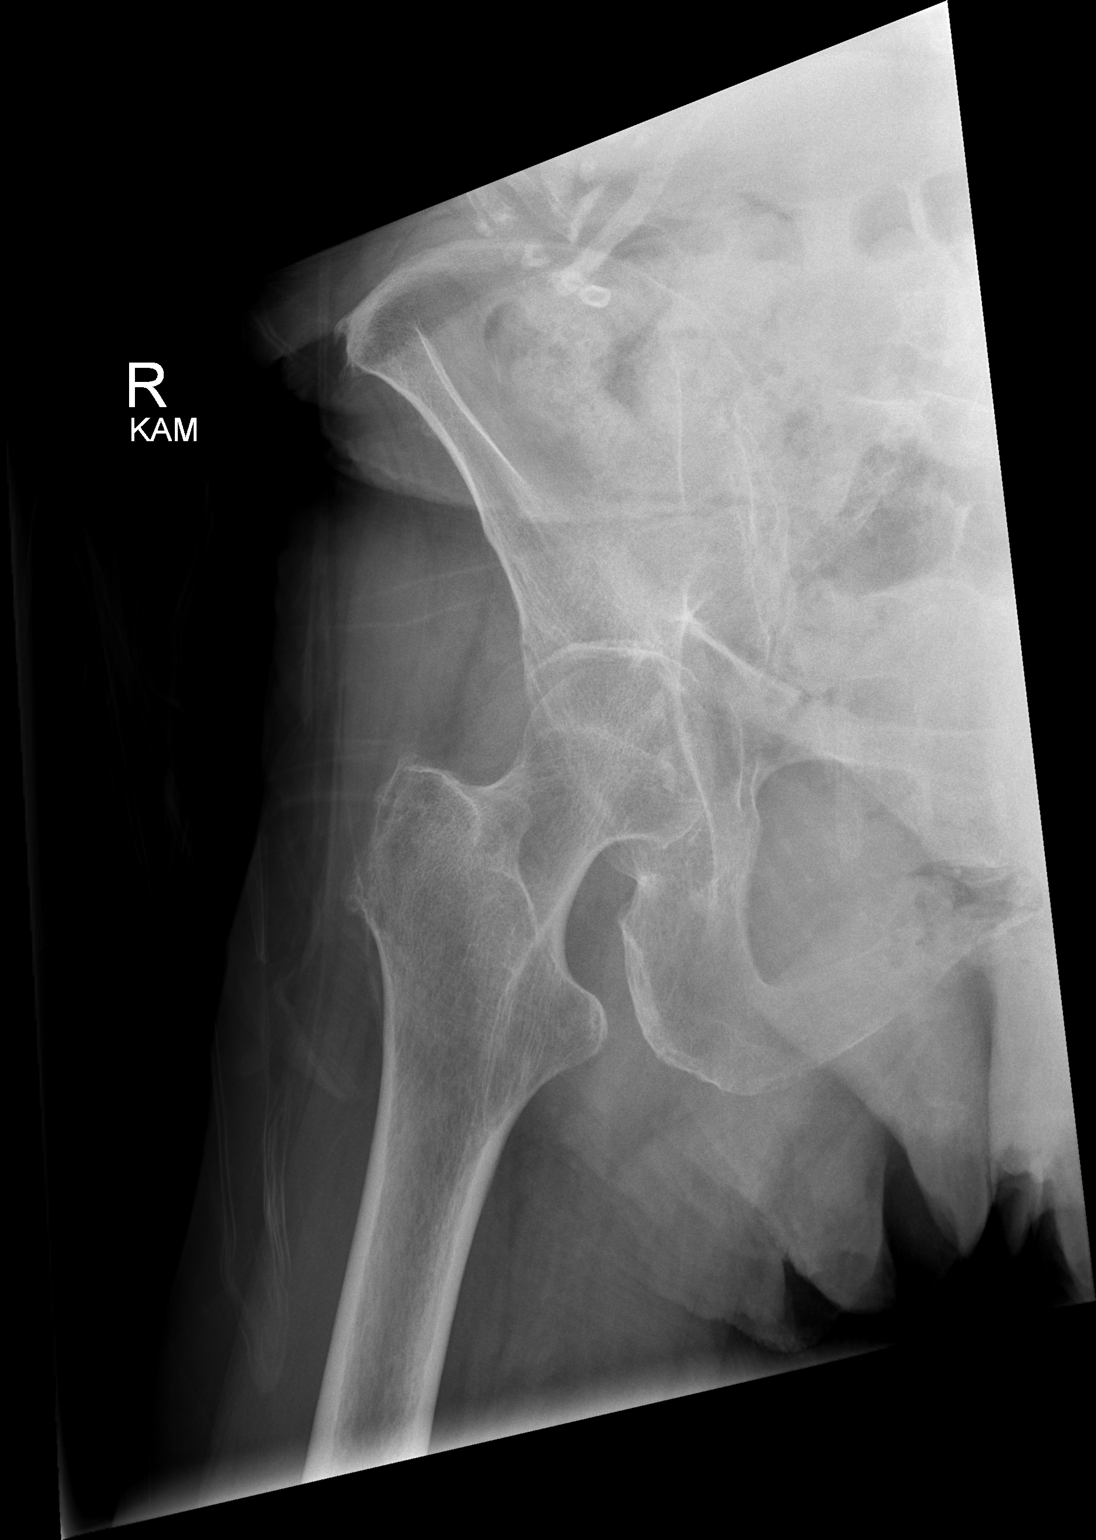

[2 of 2 positions shown; findings below may reference images not displayed]

FINDINGS: No fracture.  No bone lesion.

Hip joint is normally spaced and aligned.  No arthropathic change.

Soft tissues are unremarkable.
IMPRESSION: Negative.

## 2016-10-25 DIAGNOSIS — M419 Scoliosis, unspecified: Secondary | ICD-10-CM | POA: Diagnosis not present

## 2016-10-25 DIAGNOSIS — R278 Other lack of coordination: Secondary | ICD-10-CM | POA: Diagnosis not present

## 2016-10-25 DIAGNOSIS — I110 Hypertensive heart disease with heart failure: Secondary | ICD-10-CM | POA: Diagnosis not present

## 2016-10-25 DIAGNOSIS — G9009 Other idiopathic peripheral autonomic neuropathy: Secondary | ICD-10-CM | POA: Diagnosis not present

## 2016-10-25 DIAGNOSIS — G319 Degenerative disease of nervous system, unspecified: Secondary | ICD-10-CM | POA: Diagnosis not present

## 2016-10-25 DIAGNOSIS — F0280 Dementia in other diseases classified elsewhere without behavioral disturbance: Secondary | ICD-10-CM | POA: Diagnosis not present

## 2016-10-30 DIAGNOSIS — I110 Hypertensive heart disease with heart failure: Secondary | ICD-10-CM | POA: Diagnosis not present

## 2016-10-30 DIAGNOSIS — R278 Other lack of coordination: Secondary | ICD-10-CM | POA: Diagnosis not present

## 2016-10-30 DIAGNOSIS — G9009 Other idiopathic peripheral autonomic neuropathy: Secondary | ICD-10-CM | POA: Diagnosis not present

## 2016-10-30 DIAGNOSIS — Z6820 Body mass index (BMI) 20.0-20.9, adult: Secondary | ICD-10-CM | POA: Diagnosis not present

## 2016-10-30 DIAGNOSIS — G319 Degenerative disease of nervous system, unspecified: Secondary | ICD-10-CM | POA: Diagnosis not present

## 2016-10-30 DIAGNOSIS — F0280 Dementia in other diseases classified elsewhere without behavioral disturbance: Secondary | ICD-10-CM | POA: Diagnosis not present

## 2016-10-30 DIAGNOSIS — J449 Chronic obstructive pulmonary disease, unspecified: Secondary | ICD-10-CM | POA: Diagnosis not present

## 2016-10-30 DIAGNOSIS — M419 Scoliosis, unspecified: Secondary | ICD-10-CM | POA: Diagnosis not present

## 2016-11-02 DIAGNOSIS — F0280 Dementia in other diseases classified elsewhere without behavioral disturbance: Secondary | ICD-10-CM | POA: Diagnosis not present

## 2016-11-02 DIAGNOSIS — M419 Scoliosis, unspecified: Secondary | ICD-10-CM | POA: Diagnosis not present

## 2016-11-02 DIAGNOSIS — G319 Degenerative disease of nervous system, unspecified: Secondary | ICD-10-CM | POA: Diagnosis not present

## 2016-11-02 DIAGNOSIS — I110 Hypertensive heart disease with heart failure: Secondary | ICD-10-CM | POA: Diagnosis not present

## 2016-11-02 DIAGNOSIS — G9009 Other idiopathic peripheral autonomic neuropathy: Secondary | ICD-10-CM | POA: Diagnosis not present

## 2016-11-02 DIAGNOSIS — R278 Other lack of coordination: Secondary | ICD-10-CM | POA: Diagnosis not present

## 2016-11-06 DIAGNOSIS — F0280 Dementia in other diseases classified elsewhere without behavioral disturbance: Secondary | ICD-10-CM | POA: Diagnosis not present

## 2016-11-06 DIAGNOSIS — I110 Hypertensive heart disease with heart failure: Secondary | ICD-10-CM | POA: Diagnosis not present

## 2016-11-06 DIAGNOSIS — G319 Degenerative disease of nervous system, unspecified: Secondary | ICD-10-CM | POA: Diagnosis not present

## 2016-11-06 DIAGNOSIS — R278 Other lack of coordination: Secondary | ICD-10-CM | POA: Diagnosis not present

## 2016-11-06 DIAGNOSIS — M419 Scoliosis, unspecified: Secondary | ICD-10-CM | POA: Diagnosis not present

## 2016-11-06 DIAGNOSIS — G9009 Other idiopathic peripheral autonomic neuropathy: Secondary | ICD-10-CM | POA: Diagnosis not present

## 2016-11-07 DIAGNOSIS — G4700 Insomnia, unspecified: Secondary | ICD-10-CM | POA: Diagnosis not present

## 2016-11-07 DIAGNOSIS — J449 Chronic obstructive pulmonary disease, unspecified: Secondary | ICD-10-CM | POA: Diagnosis not present

## 2016-11-07 DIAGNOSIS — Z6820 Body mass index (BMI) 20.0-20.9, adult: Secondary | ICD-10-CM | POA: Diagnosis not present

## 2016-11-07 DIAGNOSIS — E039 Hypothyroidism, unspecified: Secondary | ICD-10-CM | POA: Diagnosis not present

## 2016-11-07 DIAGNOSIS — G2581 Restless legs syndrome: Secondary | ICD-10-CM | POA: Diagnosis not present

## 2016-11-09 DIAGNOSIS — R278 Other lack of coordination: Secondary | ICD-10-CM | POA: Diagnosis not present

## 2016-11-09 DIAGNOSIS — G319 Degenerative disease of nervous system, unspecified: Secondary | ICD-10-CM | POA: Diagnosis not present

## 2016-11-09 DIAGNOSIS — F0280 Dementia in other diseases classified elsewhere without behavioral disturbance: Secondary | ICD-10-CM | POA: Diagnosis not present

## 2016-11-09 DIAGNOSIS — G9009 Other idiopathic peripheral autonomic neuropathy: Secondary | ICD-10-CM | POA: Diagnosis not present

## 2016-11-09 DIAGNOSIS — I110 Hypertensive heart disease with heart failure: Secondary | ICD-10-CM | POA: Diagnosis not present

## 2016-11-09 DIAGNOSIS — M419 Scoliosis, unspecified: Secondary | ICD-10-CM | POA: Diagnosis not present

## 2016-11-15 DIAGNOSIS — M419 Scoliosis, unspecified: Secondary | ICD-10-CM | POA: Diagnosis not present

## 2016-11-15 DIAGNOSIS — I110 Hypertensive heart disease with heart failure: Secondary | ICD-10-CM | POA: Diagnosis not present

## 2016-11-15 DIAGNOSIS — F0280 Dementia in other diseases classified elsewhere without behavioral disturbance: Secondary | ICD-10-CM | POA: Diagnosis not present

## 2016-11-15 DIAGNOSIS — R278 Other lack of coordination: Secondary | ICD-10-CM | POA: Diagnosis not present

## 2016-11-15 DIAGNOSIS — G319 Degenerative disease of nervous system, unspecified: Secondary | ICD-10-CM | POA: Diagnosis not present

## 2016-11-15 DIAGNOSIS — G9009 Other idiopathic peripheral autonomic neuropathy: Secondary | ICD-10-CM | POA: Diagnosis not present

## 2016-11-20 DIAGNOSIS — M419 Scoliosis, unspecified: Secondary | ICD-10-CM | POA: Diagnosis not present

## 2016-11-20 DIAGNOSIS — G9009 Other idiopathic peripheral autonomic neuropathy: Secondary | ICD-10-CM | POA: Diagnosis not present

## 2016-11-20 DIAGNOSIS — I110 Hypertensive heart disease with heart failure: Secondary | ICD-10-CM | POA: Diagnosis not present

## 2016-11-20 DIAGNOSIS — R278 Other lack of coordination: Secondary | ICD-10-CM | POA: Diagnosis not present

## 2016-11-20 DIAGNOSIS — G319 Degenerative disease of nervous system, unspecified: Secondary | ICD-10-CM | POA: Diagnosis not present

## 2016-11-20 DIAGNOSIS — F0280 Dementia in other diseases classified elsewhere without behavioral disturbance: Secondary | ICD-10-CM | POA: Diagnosis not present

## 2016-11-21 DIAGNOSIS — Z6820 Body mass index (BMI) 20.0-20.9, adult: Secondary | ICD-10-CM | POA: Diagnosis not present

## 2016-11-21 DIAGNOSIS — J449 Chronic obstructive pulmonary disease, unspecified: Secondary | ICD-10-CM | POA: Diagnosis not present

## 2016-11-21 DIAGNOSIS — E039 Hypothyroidism, unspecified: Secondary | ICD-10-CM | POA: Diagnosis not present

## 2016-11-21 DIAGNOSIS — R270 Ataxia, unspecified: Secondary | ICD-10-CM | POA: Diagnosis not present

## 2016-11-21 DIAGNOSIS — G2581 Restless legs syndrome: Secondary | ICD-10-CM | POA: Diagnosis not present

## 2016-11-23 DIAGNOSIS — I110 Hypertensive heart disease with heart failure: Secondary | ICD-10-CM | POA: Diagnosis not present

## 2016-11-23 DIAGNOSIS — M419 Scoliosis, unspecified: Secondary | ICD-10-CM | POA: Diagnosis not present

## 2016-11-23 DIAGNOSIS — G9009 Other idiopathic peripheral autonomic neuropathy: Secondary | ICD-10-CM | POA: Diagnosis not present

## 2016-11-23 DIAGNOSIS — R278 Other lack of coordination: Secondary | ICD-10-CM | POA: Diagnosis not present

## 2016-11-23 DIAGNOSIS — F0280 Dementia in other diseases classified elsewhere without behavioral disturbance: Secondary | ICD-10-CM | POA: Diagnosis not present

## 2016-11-23 DIAGNOSIS — G319 Degenerative disease of nervous system, unspecified: Secondary | ICD-10-CM | POA: Diagnosis not present

## 2016-11-27 DIAGNOSIS — F0280 Dementia in other diseases classified elsewhere without behavioral disturbance: Secondary | ICD-10-CM | POA: Diagnosis not present

## 2016-11-27 DIAGNOSIS — M419 Scoliosis, unspecified: Secondary | ICD-10-CM | POA: Diagnosis not present

## 2016-11-27 DIAGNOSIS — R278 Other lack of coordination: Secondary | ICD-10-CM | POA: Diagnosis not present

## 2016-11-27 DIAGNOSIS — G9009 Other idiopathic peripheral autonomic neuropathy: Secondary | ICD-10-CM | POA: Diagnosis not present

## 2016-11-27 DIAGNOSIS — I110 Hypertensive heart disease with heart failure: Secondary | ICD-10-CM | POA: Diagnosis not present

## 2016-11-27 DIAGNOSIS — G319 Degenerative disease of nervous system, unspecified: Secondary | ICD-10-CM | POA: Diagnosis not present

## 2016-12-01 DIAGNOSIS — R278 Other lack of coordination: Secondary | ICD-10-CM | POA: Diagnosis not present

## 2016-12-01 DIAGNOSIS — F0280 Dementia in other diseases classified elsewhere without behavioral disturbance: Secondary | ICD-10-CM | POA: Diagnosis not present

## 2016-12-01 DIAGNOSIS — G319 Degenerative disease of nervous system, unspecified: Secondary | ICD-10-CM | POA: Diagnosis not present

## 2016-12-01 DIAGNOSIS — G9009 Other idiopathic peripheral autonomic neuropathy: Secondary | ICD-10-CM | POA: Diagnosis not present

## 2016-12-01 DIAGNOSIS — I110 Hypertensive heart disease with heart failure: Secondary | ICD-10-CM | POA: Diagnosis not present

## 2016-12-01 DIAGNOSIS — M419 Scoliosis, unspecified: Secondary | ICD-10-CM | POA: Diagnosis not present

## 2016-12-18 DIAGNOSIS — H401133 Primary open-angle glaucoma, bilateral, severe stage: Secondary | ICD-10-CM | POA: Diagnosis not present

## 2016-12-18 DIAGNOSIS — J449 Chronic obstructive pulmonary disease, unspecified: Secondary | ICD-10-CM | POA: Diagnosis not present

## 2016-12-18 DIAGNOSIS — I2510 Atherosclerotic heart disease of native coronary artery without angina pectoris: Secondary | ICD-10-CM | POA: Diagnosis not present

## 2016-12-18 DIAGNOSIS — I10 Essential (primary) hypertension: Secondary | ICD-10-CM | POA: Diagnosis not present

## 2016-12-18 DIAGNOSIS — E039 Hypothyroidism, unspecified: Secondary | ICD-10-CM | POA: Diagnosis not present

## 2016-12-18 DIAGNOSIS — H04123 Dry eye syndrome of bilateral lacrimal glands: Secondary | ICD-10-CM | POA: Diagnosis not present

## 2016-12-20 DIAGNOSIS — E039 Hypothyroidism, unspecified: Secondary | ICD-10-CM | POA: Diagnosis not present

## 2016-12-20 DIAGNOSIS — Z6820 Body mass index (BMI) 20.0-20.9, adult: Secondary | ICD-10-CM | POA: Diagnosis not present

## 2016-12-20 DIAGNOSIS — E559 Vitamin D deficiency, unspecified: Secondary | ICD-10-CM | POA: Diagnosis not present

## 2016-12-20 DIAGNOSIS — G2581 Restless legs syndrome: Secondary | ICD-10-CM | POA: Diagnosis not present

## 2016-12-20 DIAGNOSIS — R270 Ataxia, unspecified: Secondary | ICD-10-CM | POA: Diagnosis not present

## 2016-12-20 DIAGNOSIS — J449 Chronic obstructive pulmonary disease, unspecified: Secondary | ICD-10-CM | POA: Diagnosis not present

## 2016-12-20 DIAGNOSIS — R441 Visual hallucinations: Secondary | ICD-10-CM | POA: Diagnosis not present

## 2016-12-23 DIAGNOSIS — R441 Visual hallucinations: Secondary | ICD-10-CM | POA: Diagnosis not present

## 2016-12-23 DIAGNOSIS — R531 Weakness: Secondary | ICD-10-CM | POA: Diagnosis not present

## 2016-12-23 DIAGNOSIS — R05 Cough: Secondary | ICD-10-CM | POA: Diagnosis not present

## 2016-12-23 DIAGNOSIS — F23 Brief psychotic disorder: Secondary | ICD-10-CM | POA: Diagnosis not present

## 2016-12-26 DIAGNOSIS — Z6820 Body mass index (BMI) 20.0-20.9, adult: Secondary | ICD-10-CM | POA: Diagnosis not present

## 2016-12-26 DIAGNOSIS — R441 Visual hallucinations: Secondary | ICD-10-CM | POA: Diagnosis not present

## 2016-12-28 DIAGNOSIS — I509 Heart failure, unspecified: Secondary | ICD-10-CM | POA: Diagnosis not present

## 2016-12-28 DIAGNOSIS — Z950 Presence of cardiac pacemaker: Secondary | ICD-10-CM | POA: Diagnosis not present

## 2016-12-28 DIAGNOSIS — Z7982 Long term (current) use of aspirin: Secondary | ICD-10-CM | POA: Diagnosis not present

## 2016-12-28 DIAGNOSIS — Z79899 Other long term (current) drug therapy: Secondary | ICD-10-CM | POA: Diagnosis not present

## 2016-12-28 DIAGNOSIS — I110 Hypertensive heart disease with heart failure: Secondary | ICD-10-CM | POA: Diagnosis not present

## 2016-12-28 DIAGNOSIS — M1990 Unspecified osteoarthritis, unspecified site: Secondary | ICD-10-CM | POA: Diagnosis not present

## 2016-12-28 DIAGNOSIS — M5489 Other dorsalgia: Secondary | ICD-10-CM | POA: Diagnosis not present

## 2016-12-28 DIAGNOSIS — M25471 Effusion, right ankle: Secondary | ICD-10-CM | POA: Diagnosis not present

## 2016-12-28 DIAGNOSIS — S29012A Strain of muscle and tendon of back wall of thorax, initial encounter: Secondary | ICD-10-CM | POA: Diagnosis not present

## 2016-12-28 DIAGNOSIS — M810 Age-related osteoporosis without current pathological fracture: Secondary | ICD-10-CM | POA: Diagnosis not present

## 2016-12-28 DIAGNOSIS — J449 Chronic obstructive pulmonary disease, unspecified: Secondary | ICD-10-CM | POA: Diagnosis not present

## 2016-12-28 DIAGNOSIS — R531 Weakness: Secondary | ICD-10-CM | POA: Diagnosis not present

## 2016-12-28 DIAGNOSIS — X58XXXA Exposure to other specified factors, initial encounter: Secondary | ICD-10-CM | POA: Diagnosis not present

## 2016-12-28 DIAGNOSIS — J181 Lobar pneumonia, unspecified organism: Secondary | ICD-10-CM | POA: Diagnosis not present

## 2016-12-28 DIAGNOSIS — M25472 Effusion, left ankle: Secondary | ICD-10-CM | POA: Diagnosis not present

## 2016-12-28 DIAGNOSIS — I959 Hypotension, unspecified: Secondary | ICD-10-CM | POA: Diagnosis not present

## 2016-12-29 IMAGING — XA Imaging study
2 series · 2 of 2 positions shown · non-contrast
Comparison: none

CLINICAL DATA: Lumbosacral spondylosis without myelopathy. Patient
reports approximately 75% relief following the prior caudal epidural
injection, with pain recently returning greatest in the right knee
and hip regions.

[Series 1: ortho standard · 1 of 1 slices shown (1 of 2)]
[im 1/1]
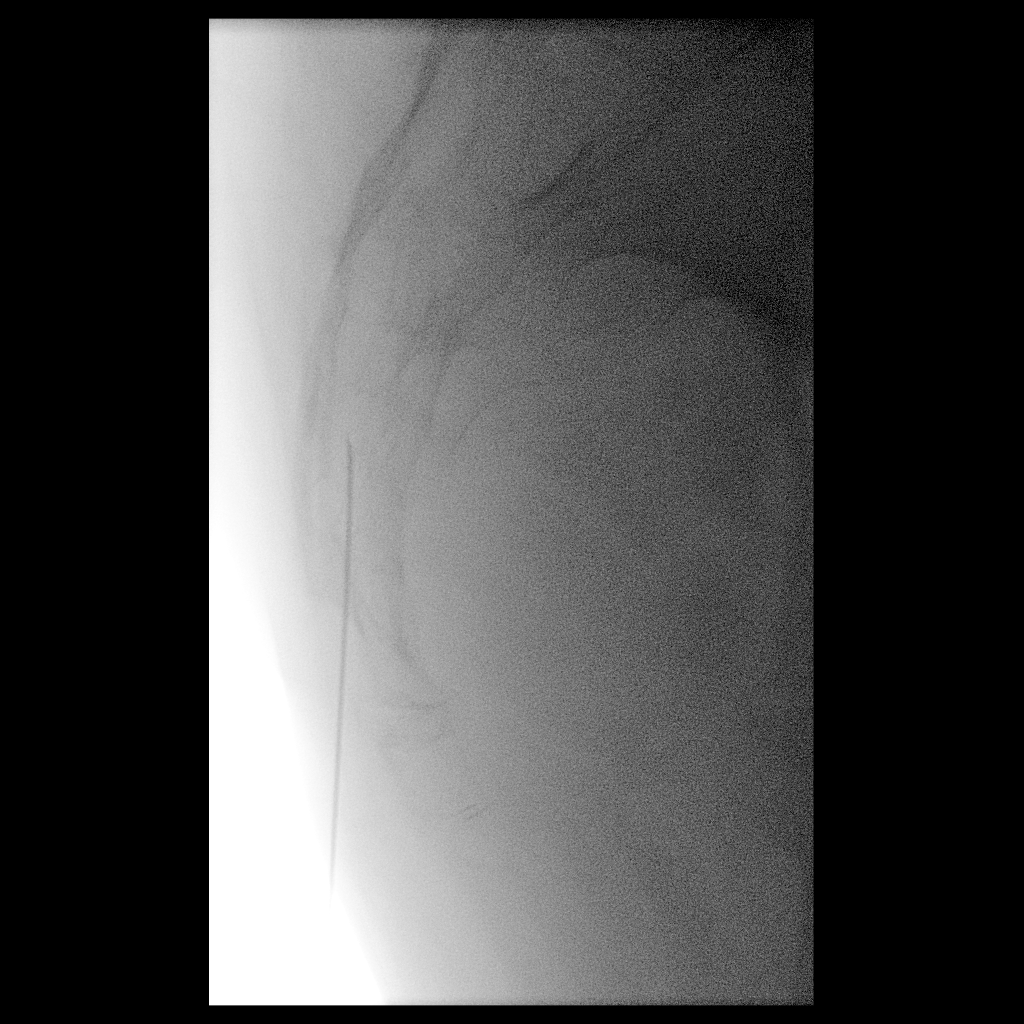

[Series 2: ortho standard · 1 of 1 slices shown (2 of 2)]
[im 1/1]
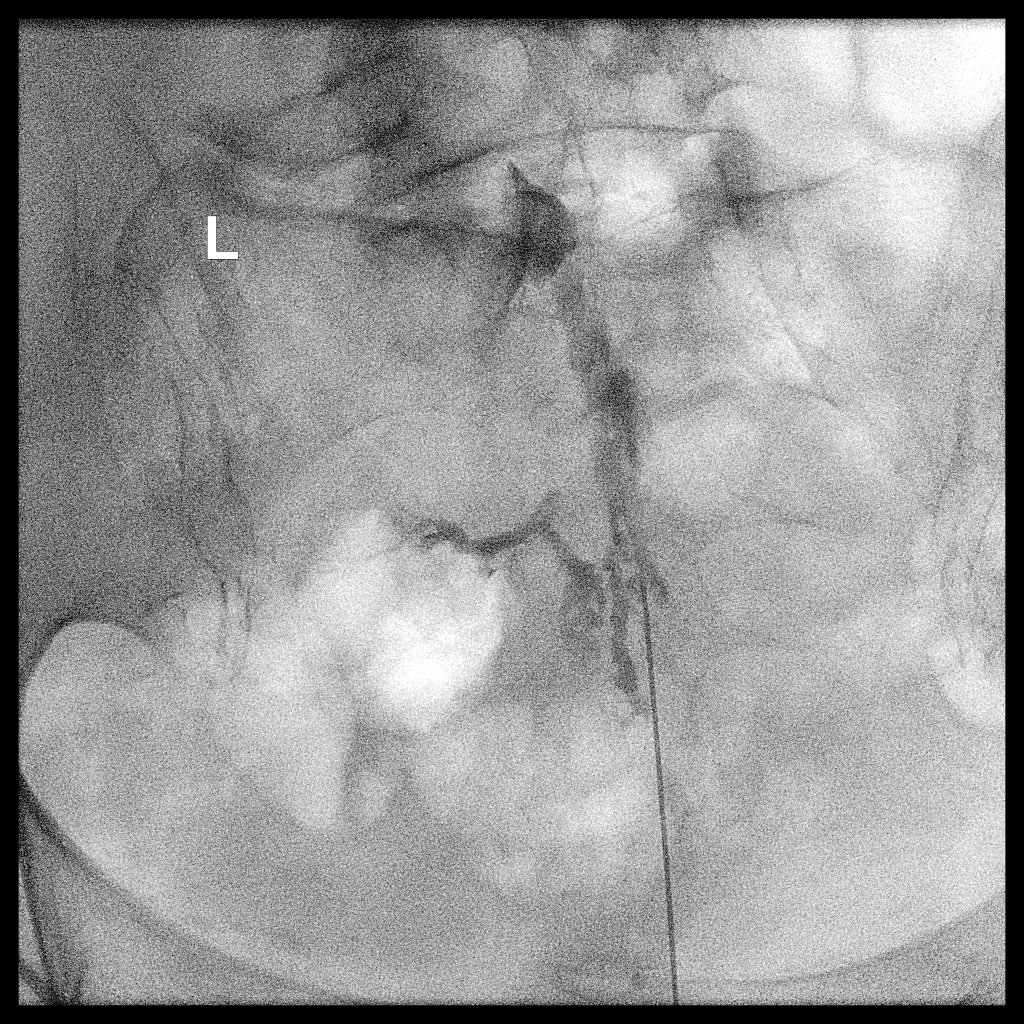

[2 of 2 positions shown; findings below may reference images not displayed]

FLUOROSCOPY TIME:  Radiation Exposure Index (as provided by the
fluoroscopic device): 22.86 microGray*m^2

Fluoroscopy Time (in minutes and seconds):  5 seconds

EXAM:
CAUDAL EPIDURAL INJECTION

Utilizing a caudal approach, the skin overlying the sacral hiatus
was cleansed and anesthetized. A 3.5 inch, 22 gauge epidural needle
was advanced into the sacral epidural space. Injection of Isovue-M
200 shows a good epidural pattern with spread up to L5-S1. No
vascular opacification is seen.

120 mg of Depo-Medrol mixed with 3 ml of normal saline and 3 ml of
1% Lidocaine were instilled. The procedure was well-tolerated, and
the patient was discharged thirty minutes following the injection in
good condition.
IMPRESSION: Technically successful caudal epidural injection #2.

## 2017-01-04 DIAGNOSIS — Z4509 Encounter for adjustment and management of other cardiac device: Secondary | ICD-10-CM | POA: Diagnosis not present

## 2017-01-04 DIAGNOSIS — I442 Atrioventricular block, complete: Secondary | ICD-10-CM | POA: Diagnosis not present

## 2017-01-05 DIAGNOSIS — J189 Pneumonia, unspecified organism: Secondary | ICD-10-CM | POA: Diagnosis not present

## 2017-01-05 DIAGNOSIS — Z7982 Long term (current) use of aspirin: Secondary | ICD-10-CM | POA: Diagnosis not present

## 2017-01-05 DIAGNOSIS — G2581 Restless legs syndrome: Secondary | ICD-10-CM | POA: Diagnosis present

## 2017-01-05 DIAGNOSIS — R636 Underweight: Secondary | ICD-10-CM | POA: Diagnosis present

## 2017-01-05 DIAGNOSIS — R7989 Other specified abnormal findings of blood chemistry: Secondary | ICD-10-CM | POA: Diagnosis not present

## 2017-01-05 DIAGNOSIS — Z79899 Other long term (current) drug therapy: Secondary | ICD-10-CM | POA: Diagnosis not present

## 2017-01-05 DIAGNOSIS — M179 Osteoarthritis of knee, unspecified: Secondary | ICD-10-CM | POA: Diagnosis not present

## 2017-01-05 DIAGNOSIS — I509 Heart failure, unspecified: Secondary | ICD-10-CM | POA: Diagnosis not present

## 2017-01-05 DIAGNOSIS — E039 Hypothyroidism, unspecified: Secondary | ICD-10-CM | POA: Diagnosis not present

## 2017-01-05 DIAGNOSIS — N183 Chronic kidney disease, stage 3 (moderate): Secondary | ICD-10-CM | POA: Diagnosis not present

## 2017-01-05 DIAGNOSIS — Z8701 Personal history of pneumonia (recurrent): Secondary | ICD-10-CM | POA: Diagnosis not present

## 2017-01-05 DIAGNOSIS — Z888 Allergy status to other drugs, medicaments and biological substances status: Secondary | ICD-10-CM | POA: Diagnosis not present

## 2017-01-05 DIAGNOSIS — Z880 Allergy status to penicillin: Secondary | ICD-10-CM | POA: Diagnosis not present

## 2017-01-05 DIAGNOSIS — R531 Weakness: Secondary | ICD-10-CM | POA: Diagnosis not present

## 2017-01-05 DIAGNOSIS — I5033 Acute on chronic diastolic (congestive) heart failure: Secondary | ICD-10-CM | POA: Diagnosis not present

## 2017-01-05 DIAGNOSIS — Z681 Body mass index (BMI) 19 or less, adult: Secondary | ICD-10-CM | POA: Diagnosis not present

## 2017-01-05 DIAGNOSIS — M25561 Pain in right knee: Secondary | ICD-10-CM | POA: Diagnosis not present

## 2017-01-05 DIAGNOSIS — I2510 Atherosclerotic heart disease of native coronary artery without angina pectoris: Secondary | ICD-10-CM | POA: Diagnosis present

## 2017-01-05 DIAGNOSIS — Z886 Allergy status to analgesic agent status: Secondary | ICD-10-CM | POA: Diagnosis not present

## 2017-01-05 DIAGNOSIS — I110 Hypertensive heart disease with heart failure: Secondary | ICD-10-CM | POA: Diagnosis not present

## 2017-01-05 DIAGNOSIS — M810 Age-related osteoporosis without current pathological fracture: Secondary | ICD-10-CM | POA: Diagnosis present

## 2017-01-05 DIAGNOSIS — J181 Lobar pneumonia, unspecified organism: Secondary | ICD-10-CM | POA: Diagnosis not present

## 2017-01-05 DIAGNOSIS — R404 Transient alteration of awareness: Secondary | ICD-10-CM | POA: Diagnosis not present

## 2017-01-05 DIAGNOSIS — J449 Chronic obstructive pulmonary disease, unspecified: Secondary | ICD-10-CM | POA: Diagnosis not present

## 2017-01-05 DIAGNOSIS — D638 Anemia in other chronic diseases classified elsewhere: Secondary | ICD-10-CM | POA: Diagnosis not present

## 2017-01-05 DIAGNOSIS — Z66 Do not resuscitate: Secondary | ICD-10-CM | POA: Diagnosis present

## 2017-01-11 DIAGNOSIS — Z45018 Encounter for adjustment and management of other part of cardiac pacemaker: Secondary | ICD-10-CM | POA: Diagnosis not present

## 2017-01-11 DIAGNOSIS — I442 Atrioventricular block, complete: Secondary | ICD-10-CM | POA: Diagnosis not present

## 2017-01-12 DIAGNOSIS — L209 Atopic dermatitis, unspecified: Secondary | ICD-10-CM | POA: Diagnosis not present

## 2017-01-12 DIAGNOSIS — H04123 Dry eye syndrome of bilateral lacrimal glands: Secondary | ICD-10-CM | POA: Diagnosis not present

## 2017-01-12 DIAGNOSIS — H5713 Ocular pain, bilateral: Secondary | ICD-10-CM | POA: Diagnosis not present

## 2017-01-12 DIAGNOSIS — H16223 Keratoconjunctivitis sicca, not specified as Sjogren's, bilateral: Secondary | ICD-10-CM | POA: Diagnosis not present

## 2017-01-15 DIAGNOSIS — G4700 Insomnia, unspecified: Secondary | ICD-10-CM | POA: Diagnosis not present

## 2017-01-15 DIAGNOSIS — G2581 Restless legs syndrome: Secondary | ICD-10-CM | POA: Diagnosis not present

## 2017-01-15 DIAGNOSIS — E039 Hypothyroidism, unspecified: Secondary | ICD-10-CM | POA: Diagnosis not present

## 2017-01-15 DIAGNOSIS — R609 Edema, unspecified: Secondary | ICD-10-CM | POA: Diagnosis not present

## 2017-01-15 DIAGNOSIS — Z6820 Body mass index (BMI) 20.0-20.9, adult: Secondary | ICD-10-CM | POA: Diagnosis not present

## 2017-01-24 DIAGNOSIS — I130 Hypertensive heart and chronic kidney disease with heart failure and stage 1 through stage 4 chronic kidney disease, or unspecified chronic kidney disease: Secondary | ICD-10-CM | POA: Diagnosis not present

## 2017-01-24 DIAGNOSIS — D649 Anemia, unspecified: Secondary | ICD-10-CM | POA: Diagnosis not present

## 2017-01-24 DIAGNOSIS — Z9981 Dependence on supplemental oxygen: Secondary | ICD-10-CM | POA: Diagnosis not present

## 2017-01-24 DIAGNOSIS — I5032 Chronic diastolic (congestive) heart failure: Secondary | ICD-10-CM | POA: Diagnosis not present

## 2017-01-24 DIAGNOSIS — R64 Cachexia: Secondary | ICD-10-CM | POA: Diagnosis present

## 2017-01-24 DIAGNOSIS — Z886 Allergy status to analgesic agent status: Secondary | ICD-10-CM | POA: Diagnosis not present

## 2017-01-24 DIAGNOSIS — I259 Chronic ischemic heart disease, unspecified: Secondary | ICD-10-CM | POA: Diagnosis not present

## 2017-01-24 DIAGNOSIS — R2689 Other abnormalities of gait and mobility: Secondary | ICD-10-CM | POA: Diagnosis not present

## 2017-01-24 DIAGNOSIS — J189 Pneumonia, unspecified organism: Secondary | ICD-10-CM | POA: Diagnosis not present

## 2017-01-24 DIAGNOSIS — J440 Chronic obstructive pulmonary disease with acute lower respiratory infection: Secondary | ICD-10-CM | POA: Diagnosis not present

## 2017-01-24 DIAGNOSIS — Z882 Allergy status to sulfonamides status: Secondary | ICD-10-CM | POA: Diagnosis not present

## 2017-01-24 DIAGNOSIS — Z79899 Other long term (current) drug therapy: Secondary | ICD-10-CM | POA: Diagnosis not present

## 2017-01-24 DIAGNOSIS — R410 Disorientation, unspecified: Secondary | ICD-10-CM | POA: Diagnosis not present

## 2017-01-24 DIAGNOSIS — Z888 Allergy status to other drugs, medicaments and biological substances status: Secondary | ICD-10-CM | POA: Diagnosis not present

## 2017-01-24 DIAGNOSIS — R404 Transient alteration of awareness: Secondary | ICD-10-CM | POA: Diagnosis not present

## 2017-01-24 DIAGNOSIS — E876 Hypokalemia: Secondary | ICD-10-CM | POA: Diagnosis not present

## 2017-01-24 DIAGNOSIS — R7989 Other specified abnormal findings of blood chemistry: Secondary | ICD-10-CM | POA: Diagnosis not present

## 2017-01-24 DIAGNOSIS — I5030 Unspecified diastolic (congestive) heart failure: Secondary | ICD-10-CM | POA: Diagnosis not present

## 2017-01-24 DIAGNOSIS — Z7982 Long term (current) use of aspirin: Secondary | ICD-10-CM | POA: Diagnosis not present

## 2017-01-24 DIAGNOSIS — N189 Chronic kidney disease, unspecified: Secondary | ICD-10-CM | POA: Diagnosis not present

## 2017-01-24 DIAGNOSIS — G4700 Insomnia, unspecified: Secondary | ICD-10-CM | POA: Diagnosis not present

## 2017-01-24 DIAGNOSIS — H409 Unspecified glaucoma: Secondary | ICD-10-CM | POA: Diagnosis present

## 2017-01-24 DIAGNOSIS — R509 Fever, unspecified: Secondary | ICD-10-CM | POA: Diagnosis not present

## 2017-01-24 DIAGNOSIS — J154 Pneumonia due to other streptococci: Secondary | ICD-10-CM | POA: Diagnosis not present

## 2017-01-24 DIAGNOSIS — Z880 Allergy status to penicillin: Secondary | ICD-10-CM | POA: Diagnosis not present

## 2017-01-24 DIAGNOSIS — E871 Hypo-osmolality and hyponatremia: Secondary | ICD-10-CM | POA: Diagnosis not present

## 2017-01-24 DIAGNOSIS — E039 Hypothyroidism, unspecified: Secondary | ICD-10-CM | POA: Diagnosis present

## 2017-01-24 DIAGNOSIS — J181 Lobar pneumonia, unspecified organism: Secondary | ICD-10-CM | POA: Diagnosis not present

## 2017-01-24 DIAGNOSIS — R531 Weakness: Secondary | ICD-10-CM | POA: Diagnosis not present

## 2017-01-24 DIAGNOSIS — F329 Major depressive disorder, single episode, unspecified: Secondary | ICD-10-CM | POA: Diagnosis not present

## 2017-01-24 DIAGNOSIS — J449 Chronic obstructive pulmonary disease, unspecified: Secondary | ICD-10-CM | POA: Diagnosis not present

## 2017-01-24 DIAGNOSIS — G2581 Restless legs syndrome: Secondary | ICD-10-CM | POA: Diagnosis present

## 2017-01-24 DIAGNOSIS — Z66 Do not resuscitate: Secondary | ICD-10-CM | POA: Diagnosis present

## 2017-01-24 DIAGNOSIS — M810 Age-related osteoporosis without current pathological fracture: Secondary | ICD-10-CM | POA: Diagnosis present

## 2017-01-24 DIAGNOSIS — M6281 Muscle weakness (generalized): Secondary | ICD-10-CM | POA: Diagnosis not present

## 2017-01-24 DIAGNOSIS — K5900 Constipation, unspecified: Secondary | ICD-10-CM | POA: Diagnosis not present

## 2017-01-24 DIAGNOSIS — I110 Hypertensive heart disease with heart failure: Secondary | ICD-10-CM | POA: Diagnosis not present

## 2017-01-29 DIAGNOSIS — G4700 Insomnia, unspecified: Secondary | ICD-10-CM | POA: Diagnosis not present

## 2017-01-29 DIAGNOSIS — I5030 Unspecified diastolic (congestive) heart failure: Secondary | ICD-10-CM | POA: Diagnosis not present

## 2017-01-29 DIAGNOSIS — D649 Anemia, unspecified: Secondary | ICD-10-CM | POA: Diagnosis not present

## 2017-01-29 DIAGNOSIS — E871 Hypo-osmolality and hyponatremia: Secondary | ICD-10-CM | POA: Diagnosis not present

## 2017-01-29 DIAGNOSIS — M6281 Muscle weakness (generalized): Secondary | ICD-10-CM | POA: Diagnosis not present

## 2017-01-29 DIAGNOSIS — I5032 Chronic diastolic (congestive) heart failure: Secondary | ICD-10-CM | POA: Diagnosis not present

## 2017-01-29 DIAGNOSIS — J440 Chronic obstructive pulmonary disease with acute lower respiratory infection: Secondary | ICD-10-CM | POA: Diagnosis not present

## 2017-01-29 DIAGNOSIS — K5900 Constipation, unspecified: Secondary | ICD-10-CM | POA: Diagnosis not present

## 2017-01-29 DIAGNOSIS — E039 Hypothyroidism, unspecified: Secondary | ICD-10-CM | POA: Diagnosis not present

## 2017-01-29 DIAGNOSIS — R2689 Other abnormalities of gait and mobility: Secondary | ICD-10-CM | POA: Diagnosis not present

## 2017-01-29 DIAGNOSIS — H409 Unspecified glaucoma: Secondary | ICD-10-CM | POA: Diagnosis not present

## 2017-01-29 DIAGNOSIS — I110 Hypertensive heart disease with heart failure: Secondary | ICD-10-CM | POA: Diagnosis not present

## 2017-01-29 DIAGNOSIS — F329 Major depressive disorder, single episode, unspecified: Secondary | ICD-10-CM | POA: Diagnosis not present

## 2017-01-29 DIAGNOSIS — J189 Pneumonia, unspecified organism: Secondary | ICD-10-CM | POA: Diagnosis not present

## 2017-01-29 DIAGNOSIS — I259 Chronic ischemic heart disease, unspecified: Secondary | ICD-10-CM | POA: Diagnosis not present

## 2017-01-29 DIAGNOSIS — J154 Pneumonia due to other streptococci: Secondary | ICD-10-CM | POA: Diagnosis not present

## 2017-02-24 DIAGNOSIS — Z7982 Long term (current) use of aspirin: Secondary | ICD-10-CM | POA: Diagnosis not present

## 2017-02-24 DIAGNOSIS — I110 Hypertensive heart disease with heart failure: Secondary | ICD-10-CM | POA: Diagnosis not present

## 2017-02-24 DIAGNOSIS — I259 Chronic ischemic heart disease, unspecified: Secondary | ICD-10-CM | POA: Diagnosis not present

## 2017-02-24 DIAGNOSIS — I5030 Unspecified diastolic (congestive) heart failure: Secondary | ICD-10-CM | POA: Diagnosis not present

## 2017-02-24 DIAGNOSIS — H409 Unspecified glaucoma: Secondary | ICD-10-CM | POA: Diagnosis not present

## 2017-02-24 DIAGNOSIS — J449 Chronic obstructive pulmonary disease, unspecified: Secondary | ICD-10-CM | POA: Diagnosis not present

## 2017-02-24 DIAGNOSIS — D649 Anemia, unspecified: Secondary | ICD-10-CM | POA: Diagnosis not present

## 2017-02-24 DIAGNOSIS — Z7951 Long term (current) use of inhaled steroids: Secondary | ICD-10-CM | POA: Diagnosis not present

## 2017-02-24 DIAGNOSIS — M1991 Primary osteoarthritis, unspecified site: Secondary | ICD-10-CM | POA: Diagnosis not present

## 2017-02-24 DIAGNOSIS — Z8701 Personal history of pneumonia (recurrent): Secondary | ICD-10-CM | POA: Diagnosis not present

## 2017-02-24 DIAGNOSIS — Z950 Presence of cardiac pacemaker: Secondary | ICD-10-CM | POA: Diagnosis not present

## 2017-02-26 DIAGNOSIS — M1991 Primary osteoarthritis, unspecified site: Secondary | ICD-10-CM | POA: Diagnosis not present

## 2017-02-26 DIAGNOSIS — Z8701 Personal history of pneumonia (recurrent): Secondary | ICD-10-CM | POA: Diagnosis not present

## 2017-02-26 DIAGNOSIS — J449 Chronic obstructive pulmonary disease, unspecified: Secondary | ICD-10-CM | POA: Diagnosis not present

## 2017-02-26 DIAGNOSIS — I259 Chronic ischemic heart disease, unspecified: Secondary | ICD-10-CM | POA: Diagnosis not present

## 2017-02-26 DIAGNOSIS — I110 Hypertensive heart disease with heart failure: Secondary | ICD-10-CM | POA: Diagnosis not present

## 2017-02-26 DIAGNOSIS — I5030 Unspecified diastolic (congestive) heart failure: Secondary | ICD-10-CM | POA: Diagnosis not present

## 2017-02-27 DIAGNOSIS — M1991 Primary osteoarthritis, unspecified site: Secondary | ICD-10-CM | POA: Diagnosis not present

## 2017-02-27 DIAGNOSIS — I259 Chronic ischemic heart disease, unspecified: Secondary | ICD-10-CM | POA: Diagnosis not present

## 2017-02-27 DIAGNOSIS — J449 Chronic obstructive pulmonary disease, unspecified: Secondary | ICD-10-CM | POA: Diagnosis not present

## 2017-02-27 DIAGNOSIS — Z8701 Personal history of pneumonia (recurrent): Secondary | ICD-10-CM | POA: Diagnosis not present

## 2017-02-27 DIAGNOSIS — R110 Nausea: Secondary | ICD-10-CM | POA: Diagnosis not present

## 2017-02-27 DIAGNOSIS — Z681 Body mass index (BMI) 19 or less, adult: Secondary | ICD-10-CM | POA: Diagnosis not present

## 2017-02-27 DIAGNOSIS — I110 Hypertensive heart disease with heart failure: Secondary | ICD-10-CM | POA: Diagnosis not present

## 2017-02-27 DIAGNOSIS — I5030 Unspecified diastolic (congestive) heart failure: Secondary | ICD-10-CM | POA: Diagnosis not present

## 2017-02-28 DIAGNOSIS — L570 Actinic keratosis: Secondary | ICD-10-CM | POA: Diagnosis not present

## 2017-02-28 DIAGNOSIS — Z85828 Personal history of other malignant neoplasm of skin: Secondary | ICD-10-CM | POA: Diagnosis not present

## 2017-02-28 DIAGNOSIS — Z8701 Personal history of pneumonia (recurrent): Secondary | ICD-10-CM | POA: Diagnosis not present

## 2017-02-28 DIAGNOSIS — I259 Chronic ischemic heart disease, unspecified: Secondary | ICD-10-CM | POA: Diagnosis not present

## 2017-02-28 DIAGNOSIS — M1991 Primary osteoarthritis, unspecified site: Secondary | ICD-10-CM | POA: Diagnosis not present

## 2017-02-28 DIAGNOSIS — I110 Hypertensive heart disease with heart failure: Secondary | ICD-10-CM | POA: Diagnosis not present

## 2017-02-28 DIAGNOSIS — J449 Chronic obstructive pulmonary disease, unspecified: Secondary | ICD-10-CM | POA: Diagnosis not present

## 2017-02-28 DIAGNOSIS — I5030 Unspecified diastolic (congestive) heart failure: Secondary | ICD-10-CM | POA: Diagnosis not present

## 2017-03-02 DIAGNOSIS — I110 Hypertensive heart disease with heart failure: Secondary | ICD-10-CM | POA: Diagnosis not present

## 2017-03-02 DIAGNOSIS — M1991 Primary osteoarthritis, unspecified site: Secondary | ICD-10-CM | POA: Diagnosis not present

## 2017-03-02 DIAGNOSIS — I259 Chronic ischemic heart disease, unspecified: Secondary | ICD-10-CM | POA: Diagnosis not present

## 2017-03-02 DIAGNOSIS — I5030 Unspecified diastolic (congestive) heart failure: Secondary | ICD-10-CM | POA: Diagnosis not present

## 2017-03-02 DIAGNOSIS — J449 Chronic obstructive pulmonary disease, unspecified: Secondary | ICD-10-CM | POA: Diagnosis not present

## 2017-03-02 DIAGNOSIS — Z8701 Personal history of pneumonia (recurrent): Secondary | ICD-10-CM | POA: Diagnosis not present

## 2017-03-04 DIAGNOSIS — W19XXXA Unspecified fall, initial encounter: Secondary | ICD-10-CM | POA: Diagnosis not present

## 2017-03-04 DIAGNOSIS — S0083XA Contusion of other part of head, initial encounter: Secondary | ICD-10-CM | POA: Diagnosis not present

## 2017-03-04 DIAGNOSIS — J449 Chronic obstructive pulmonary disease, unspecified: Secondary | ICD-10-CM | POA: Diagnosis not present

## 2017-03-04 DIAGNOSIS — Z79899 Other long term (current) drug therapy: Secondary | ICD-10-CM | POA: Diagnosis not present

## 2017-03-04 DIAGNOSIS — M810 Age-related osteoporosis without current pathological fracture: Secondary | ICD-10-CM | POA: Diagnosis not present

## 2017-03-04 DIAGNOSIS — H409 Unspecified glaucoma: Secondary | ICD-10-CM | POA: Diagnosis not present

## 2017-03-04 DIAGNOSIS — Z7982 Long term (current) use of aspirin: Secondary | ICD-10-CM | POA: Diagnosis not present

## 2017-03-04 DIAGNOSIS — S199XXA Unspecified injury of neck, initial encounter: Secondary | ICD-10-CM | POA: Diagnosis not present

## 2017-03-04 DIAGNOSIS — T148XXA Other injury of unspecified body region, initial encounter: Secondary | ICD-10-CM | POA: Diagnosis not present

## 2017-03-04 DIAGNOSIS — S0993XA Unspecified injury of face, initial encounter: Secondary | ICD-10-CM | POA: Diagnosis not present

## 2017-03-04 DIAGNOSIS — I110 Hypertensive heart disease with heart failure: Secondary | ICD-10-CM | POA: Diagnosis not present

## 2017-03-04 DIAGNOSIS — I509 Heart failure, unspecified: Secondary | ICD-10-CM | POA: Diagnosis not present

## 2017-03-04 DIAGNOSIS — G2581 Restless legs syndrome: Secondary | ICD-10-CM | POA: Diagnosis not present

## 2017-03-04 DIAGNOSIS — S42291A Other displaced fracture of upper end of right humerus, initial encounter for closed fracture: Secondary | ICD-10-CM | POA: Diagnosis not present

## 2017-03-04 DIAGNOSIS — M25511 Pain in right shoulder: Secondary | ICD-10-CM | POA: Diagnosis not present

## 2017-03-04 DIAGNOSIS — S0990XA Unspecified injury of head, initial encounter: Secondary | ICD-10-CM | POA: Diagnosis not present

## 2017-03-05 DIAGNOSIS — I259 Chronic ischemic heart disease, unspecified: Secondary | ICD-10-CM | POA: Diagnosis not present

## 2017-03-05 DIAGNOSIS — I110 Hypertensive heart disease with heart failure: Secondary | ICD-10-CM | POA: Diagnosis not present

## 2017-03-05 DIAGNOSIS — I5030 Unspecified diastolic (congestive) heart failure: Secondary | ICD-10-CM | POA: Diagnosis not present

## 2017-03-05 DIAGNOSIS — M1991 Primary osteoarthritis, unspecified site: Secondary | ICD-10-CM | POA: Diagnosis not present

## 2017-03-05 DIAGNOSIS — J449 Chronic obstructive pulmonary disease, unspecified: Secondary | ICD-10-CM | POA: Diagnosis not present

## 2017-03-05 DIAGNOSIS — Z8701 Personal history of pneumonia (recurrent): Secondary | ICD-10-CM | POA: Diagnosis not present

## 2017-03-06 DIAGNOSIS — M1991 Primary osteoarthritis, unspecified site: Secondary | ICD-10-CM | POA: Diagnosis not present

## 2017-03-06 DIAGNOSIS — I259 Chronic ischemic heart disease, unspecified: Secondary | ICD-10-CM | POA: Diagnosis not present

## 2017-03-06 DIAGNOSIS — I5030 Unspecified diastolic (congestive) heart failure: Secondary | ICD-10-CM | POA: Diagnosis not present

## 2017-03-06 DIAGNOSIS — J449 Chronic obstructive pulmonary disease, unspecified: Secondary | ICD-10-CM | POA: Diagnosis not present

## 2017-03-06 DIAGNOSIS — Z8701 Personal history of pneumonia (recurrent): Secondary | ICD-10-CM | POA: Diagnosis not present

## 2017-03-06 DIAGNOSIS — I110 Hypertensive heart disease with heart failure: Secondary | ICD-10-CM | POA: Diagnosis not present

## 2017-03-07 ENCOUNTER — Ambulatory Visit: Payer: Medicare Other | Admitting: Neurology

## 2017-03-07 DIAGNOSIS — I110 Hypertensive heart disease with heart failure: Secondary | ICD-10-CM | POA: Diagnosis not present

## 2017-03-07 DIAGNOSIS — M1991 Primary osteoarthritis, unspecified site: Secondary | ICD-10-CM | POA: Diagnosis not present

## 2017-03-07 DIAGNOSIS — I5030 Unspecified diastolic (congestive) heart failure: Secondary | ICD-10-CM | POA: Diagnosis not present

## 2017-03-07 DIAGNOSIS — I259 Chronic ischemic heart disease, unspecified: Secondary | ICD-10-CM | POA: Diagnosis not present

## 2017-03-07 DIAGNOSIS — Z8701 Personal history of pneumonia (recurrent): Secondary | ICD-10-CM | POA: Diagnosis not present

## 2017-03-07 DIAGNOSIS — J449 Chronic obstructive pulmonary disease, unspecified: Secondary | ICD-10-CM | POA: Diagnosis not present

## 2017-03-12 DIAGNOSIS — I110 Hypertensive heart disease with heart failure: Secondary | ICD-10-CM | POA: Diagnosis not present

## 2017-03-12 DIAGNOSIS — M1991 Primary osteoarthritis, unspecified site: Secondary | ICD-10-CM | POA: Diagnosis not present

## 2017-03-12 DIAGNOSIS — I259 Chronic ischemic heart disease, unspecified: Secondary | ICD-10-CM | POA: Diagnosis not present

## 2017-03-12 DIAGNOSIS — S42211A Unspecified displaced fracture of surgical neck of right humerus, initial encounter for closed fracture: Secondary | ICD-10-CM | POA: Diagnosis not present

## 2017-03-12 DIAGNOSIS — Z8701 Personal history of pneumonia (recurrent): Secondary | ICD-10-CM | POA: Diagnosis not present

## 2017-03-12 DIAGNOSIS — J449 Chronic obstructive pulmonary disease, unspecified: Secondary | ICD-10-CM | POA: Diagnosis not present

## 2017-03-12 DIAGNOSIS — I5030 Unspecified diastolic (congestive) heart failure: Secondary | ICD-10-CM | POA: Diagnosis not present

## 2017-03-15 DIAGNOSIS — M1991 Primary osteoarthritis, unspecified site: Secondary | ICD-10-CM | POA: Diagnosis not present

## 2017-03-15 DIAGNOSIS — J449 Chronic obstructive pulmonary disease, unspecified: Secondary | ICD-10-CM | POA: Diagnosis not present

## 2017-03-15 DIAGNOSIS — Z8701 Personal history of pneumonia (recurrent): Secondary | ICD-10-CM | POA: Diagnosis not present

## 2017-03-15 DIAGNOSIS — I110 Hypertensive heart disease with heart failure: Secondary | ICD-10-CM | POA: Diagnosis not present

## 2017-03-15 DIAGNOSIS — I5030 Unspecified diastolic (congestive) heart failure: Secondary | ICD-10-CM | POA: Diagnosis not present

## 2017-03-15 DIAGNOSIS — I259 Chronic ischemic heart disease, unspecified: Secondary | ICD-10-CM | POA: Diagnosis not present

## 2017-03-16 DIAGNOSIS — J449 Chronic obstructive pulmonary disease, unspecified: Secondary | ICD-10-CM | POA: Diagnosis not present

## 2017-03-16 DIAGNOSIS — Z681 Body mass index (BMI) 19 or less, adult: Secondary | ICD-10-CM | POA: Diagnosis not present

## 2017-03-16 DIAGNOSIS — M1990 Unspecified osteoarthritis, unspecified site: Secondary | ICD-10-CM | POA: Diagnosis not present

## 2017-03-16 DIAGNOSIS — J181 Lobar pneumonia, unspecified organism: Secondary | ICD-10-CM | POA: Diagnosis not present

## 2017-03-16 DIAGNOSIS — E039 Hypothyroidism, unspecified: Secondary | ICD-10-CM | POA: Diagnosis not present

## 2017-03-16 DIAGNOSIS — I509 Heart failure, unspecified: Secondary | ICD-10-CM | POA: Diagnosis not present

## 2017-03-16 DIAGNOSIS — F5104 Psychophysiologic insomnia: Secondary | ICD-10-CM | POA: Diagnosis not present

## 2017-03-19 DIAGNOSIS — I110 Hypertensive heart disease with heart failure: Secondary | ICD-10-CM | POA: Diagnosis not present

## 2017-03-19 DIAGNOSIS — J449 Chronic obstructive pulmonary disease, unspecified: Secondary | ICD-10-CM | POA: Diagnosis not present

## 2017-03-19 DIAGNOSIS — Z8701 Personal history of pneumonia (recurrent): Secondary | ICD-10-CM | POA: Diagnosis not present

## 2017-03-19 DIAGNOSIS — M1991 Primary osteoarthritis, unspecified site: Secondary | ICD-10-CM | POA: Diagnosis not present

## 2017-03-19 DIAGNOSIS — I259 Chronic ischemic heart disease, unspecified: Secondary | ICD-10-CM | POA: Diagnosis not present

## 2017-03-19 DIAGNOSIS — I5030 Unspecified diastolic (congestive) heart failure: Secondary | ICD-10-CM | POA: Diagnosis not present

## 2017-03-20 DIAGNOSIS — Z8701 Personal history of pneumonia (recurrent): Secondary | ICD-10-CM | POA: Diagnosis not present

## 2017-03-20 DIAGNOSIS — M1991 Primary osteoarthritis, unspecified site: Secondary | ICD-10-CM | POA: Diagnosis not present

## 2017-03-20 DIAGNOSIS — I5030 Unspecified diastolic (congestive) heart failure: Secondary | ICD-10-CM | POA: Diagnosis not present

## 2017-03-20 DIAGNOSIS — I259 Chronic ischemic heart disease, unspecified: Secondary | ICD-10-CM | POA: Diagnosis not present

## 2017-03-20 DIAGNOSIS — I110 Hypertensive heart disease with heart failure: Secondary | ICD-10-CM | POA: Diagnosis not present

## 2017-03-20 DIAGNOSIS — J449 Chronic obstructive pulmonary disease, unspecified: Secondary | ICD-10-CM | POA: Diagnosis not present

## 2017-03-21 DIAGNOSIS — I259 Chronic ischemic heart disease, unspecified: Secondary | ICD-10-CM | POA: Diagnosis not present

## 2017-03-21 DIAGNOSIS — I110 Hypertensive heart disease with heart failure: Secondary | ICD-10-CM | POA: Diagnosis not present

## 2017-03-21 DIAGNOSIS — J449 Chronic obstructive pulmonary disease, unspecified: Secondary | ICD-10-CM | POA: Diagnosis not present

## 2017-03-21 DIAGNOSIS — M1991 Primary osteoarthritis, unspecified site: Secondary | ICD-10-CM | POA: Diagnosis not present

## 2017-03-21 DIAGNOSIS — Z8701 Personal history of pneumonia (recurrent): Secondary | ICD-10-CM | POA: Diagnosis not present

## 2017-03-21 DIAGNOSIS — I5030 Unspecified diastolic (congestive) heart failure: Secondary | ICD-10-CM | POA: Diagnosis not present

## 2017-03-22 DIAGNOSIS — Z8701 Personal history of pneumonia (recurrent): Secondary | ICD-10-CM | POA: Diagnosis not present

## 2017-03-22 DIAGNOSIS — I110 Hypertensive heart disease with heart failure: Secondary | ICD-10-CM | POA: Diagnosis not present

## 2017-03-22 DIAGNOSIS — I259 Chronic ischemic heart disease, unspecified: Secondary | ICD-10-CM | POA: Diagnosis not present

## 2017-03-22 DIAGNOSIS — I5030 Unspecified diastolic (congestive) heart failure: Secondary | ICD-10-CM | POA: Diagnosis not present

## 2017-03-22 DIAGNOSIS — J449 Chronic obstructive pulmonary disease, unspecified: Secondary | ICD-10-CM | POA: Diagnosis not present

## 2017-03-22 DIAGNOSIS — M1991 Primary osteoarthritis, unspecified site: Secondary | ICD-10-CM | POA: Diagnosis not present

## 2017-03-26 DIAGNOSIS — J449 Chronic obstructive pulmonary disease, unspecified: Secondary | ICD-10-CM | POA: Diagnosis not present

## 2017-03-26 DIAGNOSIS — I259 Chronic ischemic heart disease, unspecified: Secondary | ICD-10-CM | POA: Diagnosis not present

## 2017-03-26 DIAGNOSIS — Z8701 Personal history of pneumonia (recurrent): Secondary | ICD-10-CM | POA: Diagnosis not present

## 2017-03-26 DIAGNOSIS — M1991 Primary osteoarthritis, unspecified site: Secondary | ICD-10-CM | POA: Diagnosis not present

## 2017-03-26 DIAGNOSIS — I5030 Unspecified diastolic (congestive) heart failure: Secondary | ICD-10-CM | POA: Diagnosis not present

## 2017-03-26 DIAGNOSIS — I110 Hypertensive heart disease with heart failure: Secondary | ICD-10-CM | POA: Diagnosis not present

## 2017-03-27 DIAGNOSIS — I110 Hypertensive heart disease with heart failure: Secondary | ICD-10-CM | POA: Diagnosis not present

## 2017-03-27 DIAGNOSIS — I259 Chronic ischemic heart disease, unspecified: Secondary | ICD-10-CM | POA: Diagnosis not present

## 2017-03-27 DIAGNOSIS — J449 Chronic obstructive pulmonary disease, unspecified: Secondary | ICD-10-CM | POA: Diagnosis not present

## 2017-03-27 DIAGNOSIS — M1991 Primary osteoarthritis, unspecified site: Secondary | ICD-10-CM | POA: Diagnosis not present

## 2017-03-27 DIAGNOSIS — I5030 Unspecified diastolic (congestive) heart failure: Secondary | ICD-10-CM | POA: Diagnosis not present

## 2017-03-27 DIAGNOSIS — Z8701 Personal history of pneumonia (recurrent): Secondary | ICD-10-CM | POA: Diagnosis not present

## 2017-03-28 DIAGNOSIS — I259 Chronic ischemic heart disease, unspecified: Secondary | ICD-10-CM | POA: Diagnosis not present

## 2017-03-28 DIAGNOSIS — I5030 Unspecified diastolic (congestive) heart failure: Secondary | ICD-10-CM | POA: Diagnosis not present

## 2017-03-28 DIAGNOSIS — I110 Hypertensive heart disease with heart failure: Secondary | ICD-10-CM | POA: Diagnosis not present

## 2017-03-28 DIAGNOSIS — Z8701 Personal history of pneumonia (recurrent): Secondary | ICD-10-CM | POA: Diagnosis not present

## 2017-03-28 DIAGNOSIS — J449 Chronic obstructive pulmonary disease, unspecified: Secondary | ICD-10-CM | POA: Diagnosis not present

## 2017-03-28 DIAGNOSIS — M1991 Primary osteoarthritis, unspecified site: Secondary | ICD-10-CM | POA: Diagnosis not present

## 2017-03-29 DIAGNOSIS — K210 Gastro-esophageal reflux disease with esophagitis: Secondary | ICD-10-CM | POA: Diagnosis not present

## 2017-03-29 DIAGNOSIS — M1991 Primary osteoarthritis, unspecified site: Secondary | ICD-10-CM | POA: Diagnosis not present

## 2017-03-29 DIAGNOSIS — I5030 Unspecified diastolic (congestive) heart failure: Secondary | ICD-10-CM | POA: Diagnosis not present

## 2017-03-29 DIAGNOSIS — Z8701 Personal history of pneumonia (recurrent): Secondary | ICD-10-CM | POA: Diagnosis not present

## 2017-03-29 DIAGNOSIS — I10 Essential (primary) hypertension: Secondary | ICD-10-CM | POA: Diagnosis not present

## 2017-03-29 DIAGNOSIS — I110 Hypertensive heart disease with heart failure: Secondary | ICD-10-CM | POA: Diagnosis not present

## 2017-03-29 DIAGNOSIS — J449 Chronic obstructive pulmonary disease, unspecified: Secondary | ICD-10-CM | POA: Diagnosis not present

## 2017-03-29 DIAGNOSIS — I259 Chronic ischemic heart disease, unspecified: Secondary | ICD-10-CM | POA: Diagnosis not present

## 2017-04-02 DIAGNOSIS — M1991 Primary osteoarthritis, unspecified site: Secondary | ICD-10-CM | POA: Diagnosis not present

## 2017-04-02 DIAGNOSIS — I259 Chronic ischemic heart disease, unspecified: Secondary | ICD-10-CM | POA: Diagnosis not present

## 2017-04-02 DIAGNOSIS — Z8701 Personal history of pneumonia (recurrent): Secondary | ICD-10-CM | POA: Diagnosis not present

## 2017-04-02 DIAGNOSIS — I110 Hypertensive heart disease with heart failure: Secondary | ICD-10-CM | POA: Diagnosis not present

## 2017-04-02 DIAGNOSIS — I5030 Unspecified diastolic (congestive) heart failure: Secondary | ICD-10-CM | POA: Diagnosis not present

## 2017-04-02 DIAGNOSIS — J449 Chronic obstructive pulmonary disease, unspecified: Secondary | ICD-10-CM | POA: Diagnosis not present

## 2017-04-04 DIAGNOSIS — Z8701 Personal history of pneumonia (recurrent): Secondary | ICD-10-CM | POA: Diagnosis not present

## 2017-04-04 DIAGNOSIS — M1991 Primary osteoarthritis, unspecified site: Secondary | ICD-10-CM | POA: Diagnosis not present

## 2017-04-04 DIAGNOSIS — I259 Chronic ischemic heart disease, unspecified: Secondary | ICD-10-CM | POA: Diagnosis not present

## 2017-04-04 DIAGNOSIS — I5030 Unspecified diastolic (congestive) heart failure: Secondary | ICD-10-CM | POA: Diagnosis not present

## 2017-04-04 DIAGNOSIS — I110 Hypertensive heart disease with heart failure: Secondary | ICD-10-CM | POA: Diagnosis not present

## 2017-04-04 DIAGNOSIS — S42211D Unspecified displaced fracture of surgical neck of right humerus, subsequent encounter for fracture with routine healing: Secondary | ICD-10-CM | POA: Diagnosis not present

## 2017-04-04 DIAGNOSIS — J449 Chronic obstructive pulmonary disease, unspecified: Secondary | ICD-10-CM | POA: Diagnosis not present

## 2017-04-05 DIAGNOSIS — I259 Chronic ischemic heart disease, unspecified: Secondary | ICD-10-CM | POA: Diagnosis not present

## 2017-04-05 DIAGNOSIS — J449 Chronic obstructive pulmonary disease, unspecified: Secondary | ICD-10-CM | POA: Diagnosis not present

## 2017-04-05 DIAGNOSIS — Z8701 Personal history of pneumonia (recurrent): Secondary | ICD-10-CM | POA: Diagnosis not present

## 2017-04-05 DIAGNOSIS — I5030 Unspecified diastolic (congestive) heart failure: Secondary | ICD-10-CM | POA: Diagnosis not present

## 2017-04-05 DIAGNOSIS — I110 Hypertensive heart disease with heart failure: Secondary | ICD-10-CM | POA: Diagnosis not present

## 2017-04-05 DIAGNOSIS — M1991 Primary osteoarthritis, unspecified site: Secondary | ICD-10-CM | POA: Diagnosis not present

## 2017-04-06 DIAGNOSIS — Z8701 Personal history of pneumonia (recurrent): Secondary | ICD-10-CM | POA: Diagnosis not present

## 2017-04-06 DIAGNOSIS — J449 Chronic obstructive pulmonary disease, unspecified: Secondary | ICD-10-CM | POA: Diagnosis not present

## 2017-04-06 DIAGNOSIS — M1991 Primary osteoarthritis, unspecified site: Secondary | ICD-10-CM | POA: Diagnosis not present

## 2017-04-06 DIAGNOSIS — I259 Chronic ischemic heart disease, unspecified: Secondary | ICD-10-CM | POA: Diagnosis not present

## 2017-04-06 DIAGNOSIS — I442 Atrioventricular block, complete: Secondary | ICD-10-CM | POA: Diagnosis not present

## 2017-04-06 DIAGNOSIS — I110 Hypertensive heart disease with heart failure: Secondary | ICD-10-CM | POA: Diagnosis not present

## 2017-04-06 DIAGNOSIS — Z45018 Encounter for adjustment and management of other part of cardiac pacemaker: Secondary | ICD-10-CM | POA: Diagnosis not present

## 2017-04-06 DIAGNOSIS — I5030 Unspecified diastolic (congestive) heart failure: Secondary | ICD-10-CM | POA: Diagnosis not present

## 2017-04-09 DIAGNOSIS — J449 Chronic obstructive pulmonary disease, unspecified: Secondary | ICD-10-CM | POA: Diagnosis not present

## 2017-04-09 DIAGNOSIS — I110 Hypertensive heart disease with heart failure: Secondary | ICD-10-CM | POA: Diagnosis not present

## 2017-04-09 DIAGNOSIS — Z8701 Personal history of pneumonia (recurrent): Secondary | ICD-10-CM | POA: Diagnosis not present

## 2017-04-09 DIAGNOSIS — I259 Chronic ischemic heart disease, unspecified: Secondary | ICD-10-CM | POA: Diagnosis not present

## 2017-04-09 DIAGNOSIS — I5030 Unspecified diastolic (congestive) heart failure: Secondary | ICD-10-CM | POA: Diagnosis not present

## 2017-04-09 DIAGNOSIS — M1991 Primary osteoarthritis, unspecified site: Secondary | ICD-10-CM | POA: Diagnosis not present

## 2017-04-11 DIAGNOSIS — J449 Chronic obstructive pulmonary disease, unspecified: Secondary | ICD-10-CM | POA: Diagnosis not present

## 2017-04-11 DIAGNOSIS — M1991 Primary osteoarthritis, unspecified site: Secondary | ICD-10-CM | POA: Diagnosis not present

## 2017-04-11 DIAGNOSIS — I5030 Unspecified diastolic (congestive) heart failure: Secondary | ICD-10-CM | POA: Diagnosis not present

## 2017-04-11 DIAGNOSIS — I259 Chronic ischemic heart disease, unspecified: Secondary | ICD-10-CM | POA: Diagnosis not present

## 2017-04-11 DIAGNOSIS — I110 Hypertensive heart disease with heart failure: Secondary | ICD-10-CM | POA: Diagnosis not present

## 2017-04-11 DIAGNOSIS — Z8701 Personal history of pneumonia (recurrent): Secondary | ICD-10-CM | POA: Diagnosis not present

## 2017-04-16 DIAGNOSIS — H401133 Primary open-angle glaucoma, bilateral, severe stage: Secondary | ICD-10-CM | POA: Diagnosis not present

## 2017-04-16 DIAGNOSIS — J449 Chronic obstructive pulmonary disease, unspecified: Secondary | ICD-10-CM | POA: Diagnosis not present

## 2017-04-16 DIAGNOSIS — I5030 Unspecified diastolic (congestive) heart failure: Secondary | ICD-10-CM | POA: Diagnosis not present

## 2017-04-16 DIAGNOSIS — M1991 Primary osteoarthritis, unspecified site: Secondary | ICD-10-CM | POA: Diagnosis not present

## 2017-04-16 DIAGNOSIS — I110 Hypertensive heart disease with heart failure: Secondary | ICD-10-CM | POA: Diagnosis not present

## 2017-04-16 DIAGNOSIS — Z8701 Personal history of pneumonia (recurrent): Secondary | ICD-10-CM | POA: Diagnosis not present

## 2017-04-16 DIAGNOSIS — Z961 Presence of intraocular lens: Secondary | ICD-10-CM | POA: Diagnosis not present

## 2017-04-16 DIAGNOSIS — H04123 Dry eye syndrome of bilateral lacrimal glands: Secondary | ICD-10-CM | POA: Diagnosis not present

## 2017-04-16 DIAGNOSIS — I259 Chronic ischemic heart disease, unspecified: Secondary | ICD-10-CM | POA: Diagnosis not present

## 2017-04-18 DIAGNOSIS — I110 Hypertensive heart disease with heart failure: Secondary | ICD-10-CM | POA: Diagnosis not present

## 2017-04-18 DIAGNOSIS — I5030 Unspecified diastolic (congestive) heart failure: Secondary | ICD-10-CM | POA: Diagnosis not present

## 2017-04-18 DIAGNOSIS — I259 Chronic ischemic heart disease, unspecified: Secondary | ICD-10-CM | POA: Diagnosis not present

## 2017-04-18 DIAGNOSIS — J449 Chronic obstructive pulmonary disease, unspecified: Secondary | ICD-10-CM | POA: Diagnosis not present

## 2017-04-18 DIAGNOSIS — Z8701 Personal history of pneumonia (recurrent): Secondary | ICD-10-CM | POA: Diagnosis not present

## 2017-04-18 DIAGNOSIS — M1991 Primary osteoarthritis, unspecified site: Secondary | ICD-10-CM | POA: Diagnosis not present

## 2017-04-23 DIAGNOSIS — I259 Chronic ischemic heart disease, unspecified: Secondary | ICD-10-CM | POA: Diagnosis not present

## 2017-04-23 DIAGNOSIS — I110 Hypertensive heart disease with heart failure: Secondary | ICD-10-CM | POA: Diagnosis not present

## 2017-04-23 DIAGNOSIS — Z8701 Personal history of pneumonia (recurrent): Secondary | ICD-10-CM | POA: Diagnosis not present

## 2017-04-23 DIAGNOSIS — M1991 Primary osteoarthritis, unspecified site: Secondary | ICD-10-CM | POA: Diagnosis not present

## 2017-04-23 DIAGNOSIS — J449 Chronic obstructive pulmonary disease, unspecified: Secondary | ICD-10-CM | POA: Diagnosis not present

## 2017-04-23 DIAGNOSIS — I5030 Unspecified diastolic (congestive) heart failure: Secondary | ICD-10-CM | POA: Diagnosis not present

## 2017-04-24 DIAGNOSIS — J449 Chronic obstructive pulmonary disease, unspecified: Secondary | ICD-10-CM | POA: Diagnosis not present

## 2017-04-24 DIAGNOSIS — I259 Chronic ischemic heart disease, unspecified: Secondary | ICD-10-CM | POA: Diagnosis not present

## 2017-04-24 DIAGNOSIS — I110 Hypertensive heart disease with heart failure: Secondary | ICD-10-CM | POA: Diagnosis not present

## 2017-04-24 DIAGNOSIS — Z8701 Personal history of pneumonia (recurrent): Secondary | ICD-10-CM | POA: Diagnosis not present

## 2017-04-24 DIAGNOSIS — M1991 Primary osteoarthritis, unspecified site: Secondary | ICD-10-CM | POA: Diagnosis not present

## 2017-04-24 DIAGNOSIS — I5030 Unspecified diastolic (congestive) heart failure: Secondary | ICD-10-CM | POA: Diagnosis not present

## 2017-04-25 DIAGNOSIS — Z9181 History of falling: Secondary | ICD-10-CM | POA: Diagnosis not present

## 2017-04-25 DIAGNOSIS — H409 Unspecified glaucoma: Secondary | ICD-10-CM | POA: Diagnosis not present

## 2017-04-25 DIAGNOSIS — M1991 Primary osteoarthritis, unspecified site: Secondary | ICD-10-CM | POA: Diagnosis not present

## 2017-04-25 DIAGNOSIS — Z950 Presence of cardiac pacemaker: Secondary | ICD-10-CM | POA: Diagnosis not present

## 2017-04-25 DIAGNOSIS — S4291XD Fracture of right shoulder girdle, part unspecified, subsequent encounter for fracture with routine healing: Secondary | ICD-10-CM | POA: Diagnosis not present

## 2017-04-25 DIAGNOSIS — I110 Hypertensive heart disease with heart failure: Secondary | ICD-10-CM | POA: Diagnosis not present

## 2017-04-25 DIAGNOSIS — W19XXXD Unspecified fall, subsequent encounter: Secondary | ICD-10-CM | POA: Diagnosis not present

## 2017-04-25 DIAGNOSIS — I5030 Unspecified diastolic (congestive) heart failure: Secondary | ICD-10-CM | POA: Diagnosis not present

## 2017-04-25 DIAGNOSIS — Z8701 Personal history of pneumonia (recurrent): Secondary | ICD-10-CM | POA: Diagnosis not present

## 2017-04-25 DIAGNOSIS — I259 Chronic ischemic heart disease, unspecified: Secondary | ICD-10-CM | POA: Diagnosis not present

## 2017-04-25 DIAGNOSIS — Z7982 Long term (current) use of aspirin: Secondary | ICD-10-CM | POA: Diagnosis not present

## 2017-04-25 DIAGNOSIS — Z7951 Long term (current) use of inhaled steroids: Secondary | ICD-10-CM | POA: Diagnosis not present

## 2017-04-25 DIAGNOSIS — J449 Chronic obstructive pulmonary disease, unspecified: Secondary | ICD-10-CM | POA: Diagnosis not present

## 2017-04-25 DIAGNOSIS — D649 Anemia, unspecified: Secondary | ICD-10-CM | POA: Diagnosis not present

## 2017-04-30 DIAGNOSIS — I259 Chronic ischemic heart disease, unspecified: Secondary | ICD-10-CM | POA: Diagnosis not present

## 2017-04-30 DIAGNOSIS — S4291XD Fracture of right shoulder girdle, part unspecified, subsequent encounter for fracture with routine healing: Secondary | ICD-10-CM | POA: Diagnosis not present

## 2017-04-30 DIAGNOSIS — I110 Hypertensive heart disease with heart failure: Secondary | ICD-10-CM | POA: Diagnosis not present

## 2017-04-30 DIAGNOSIS — J449 Chronic obstructive pulmonary disease, unspecified: Secondary | ICD-10-CM | POA: Diagnosis not present

## 2017-04-30 DIAGNOSIS — M1991 Primary osteoarthritis, unspecified site: Secondary | ICD-10-CM | POA: Diagnosis not present

## 2017-04-30 DIAGNOSIS — I5030 Unspecified diastolic (congestive) heart failure: Secondary | ICD-10-CM | POA: Diagnosis not present

## 2017-05-02 DIAGNOSIS — S4291XD Fracture of right shoulder girdle, part unspecified, subsequent encounter for fracture with routine healing: Secondary | ICD-10-CM | POA: Diagnosis not present

## 2017-05-02 DIAGNOSIS — M1991 Primary osteoarthritis, unspecified site: Secondary | ICD-10-CM | POA: Diagnosis not present

## 2017-05-02 DIAGNOSIS — I110 Hypertensive heart disease with heart failure: Secondary | ICD-10-CM | POA: Diagnosis not present

## 2017-05-02 DIAGNOSIS — J449 Chronic obstructive pulmonary disease, unspecified: Secondary | ICD-10-CM | POA: Diagnosis not present

## 2017-05-02 DIAGNOSIS — I5030 Unspecified diastolic (congestive) heart failure: Secondary | ICD-10-CM | POA: Diagnosis not present

## 2017-05-02 DIAGNOSIS — I259 Chronic ischemic heart disease, unspecified: Secondary | ICD-10-CM | POA: Diagnosis not present

## 2017-05-07 DIAGNOSIS — J449 Chronic obstructive pulmonary disease, unspecified: Secondary | ICD-10-CM | POA: Diagnosis not present

## 2017-05-07 DIAGNOSIS — I259 Chronic ischemic heart disease, unspecified: Secondary | ICD-10-CM | POA: Diagnosis not present

## 2017-05-07 DIAGNOSIS — S4291XD Fracture of right shoulder girdle, part unspecified, subsequent encounter for fracture with routine healing: Secondary | ICD-10-CM | POA: Diagnosis not present

## 2017-05-07 DIAGNOSIS — M1991 Primary osteoarthritis, unspecified site: Secondary | ICD-10-CM | POA: Diagnosis not present

## 2017-05-07 DIAGNOSIS — I5030 Unspecified diastolic (congestive) heart failure: Secondary | ICD-10-CM | POA: Diagnosis not present

## 2017-05-07 DIAGNOSIS — I110 Hypertensive heart disease with heart failure: Secondary | ICD-10-CM | POA: Diagnosis not present

## 2017-05-09 DIAGNOSIS — S42211D Unspecified displaced fracture of surgical neck of right humerus, subsequent encounter for fracture with routine healing: Secondary | ICD-10-CM | POA: Diagnosis not present

## 2017-05-10 DIAGNOSIS — J449 Chronic obstructive pulmonary disease, unspecified: Secondary | ICD-10-CM | POA: Diagnosis not present

## 2017-05-10 DIAGNOSIS — M1991 Primary osteoarthritis, unspecified site: Secondary | ICD-10-CM | POA: Diagnosis not present

## 2017-05-10 DIAGNOSIS — S4291XD Fracture of right shoulder girdle, part unspecified, subsequent encounter for fracture with routine healing: Secondary | ICD-10-CM | POA: Diagnosis not present

## 2017-05-10 DIAGNOSIS — I110 Hypertensive heart disease with heart failure: Secondary | ICD-10-CM | POA: Diagnosis not present

## 2017-05-10 DIAGNOSIS — I5030 Unspecified diastolic (congestive) heart failure: Secondary | ICD-10-CM | POA: Diagnosis not present

## 2017-05-10 DIAGNOSIS — I259 Chronic ischemic heart disease, unspecified: Secondary | ICD-10-CM | POA: Diagnosis not present

## 2017-05-14 DIAGNOSIS — S4291XD Fracture of right shoulder girdle, part unspecified, subsequent encounter for fracture with routine healing: Secondary | ICD-10-CM | POA: Diagnosis not present

## 2017-05-14 DIAGNOSIS — J449 Chronic obstructive pulmonary disease, unspecified: Secondary | ICD-10-CM | POA: Diagnosis not present

## 2017-05-14 DIAGNOSIS — I110 Hypertensive heart disease with heart failure: Secondary | ICD-10-CM | POA: Diagnosis not present

## 2017-05-14 DIAGNOSIS — I259 Chronic ischemic heart disease, unspecified: Secondary | ICD-10-CM | POA: Diagnosis not present

## 2017-05-14 DIAGNOSIS — M1991 Primary osteoarthritis, unspecified site: Secondary | ICD-10-CM | POA: Diagnosis not present

## 2017-05-14 DIAGNOSIS — I5030 Unspecified diastolic (congestive) heart failure: Secondary | ICD-10-CM | POA: Diagnosis not present

## 2017-05-15 DIAGNOSIS — J449 Chronic obstructive pulmonary disease, unspecified: Secondary | ICD-10-CM | POA: Diagnosis not present

## 2017-05-15 DIAGNOSIS — Z111 Encounter for screening for respiratory tuberculosis: Secondary | ICD-10-CM | POA: Diagnosis not present

## 2017-05-15 DIAGNOSIS — Z7689 Persons encountering health services in other specified circumstances: Secondary | ICD-10-CM | POA: Diagnosis not present

## 2017-05-16 DIAGNOSIS — M1991 Primary osteoarthritis, unspecified site: Secondary | ICD-10-CM | POA: Diagnosis not present

## 2017-05-16 DIAGNOSIS — I110 Hypertensive heart disease with heart failure: Secondary | ICD-10-CM | POA: Diagnosis not present

## 2017-05-16 DIAGNOSIS — S4291XD Fracture of right shoulder girdle, part unspecified, subsequent encounter for fracture with routine healing: Secondary | ICD-10-CM | POA: Diagnosis not present

## 2017-05-16 DIAGNOSIS — I5030 Unspecified diastolic (congestive) heart failure: Secondary | ICD-10-CM | POA: Diagnosis not present

## 2017-05-16 DIAGNOSIS — I259 Chronic ischemic heart disease, unspecified: Secondary | ICD-10-CM | POA: Diagnosis not present

## 2017-05-16 DIAGNOSIS — J449 Chronic obstructive pulmonary disease, unspecified: Secondary | ICD-10-CM | POA: Diagnosis not present

## 2017-05-18 DIAGNOSIS — I5030 Unspecified diastolic (congestive) heart failure: Secondary | ICD-10-CM | POA: Diagnosis not present

## 2017-05-18 DIAGNOSIS — J449 Chronic obstructive pulmonary disease, unspecified: Secondary | ICD-10-CM | POA: Diagnosis not present

## 2017-05-18 DIAGNOSIS — I259 Chronic ischemic heart disease, unspecified: Secondary | ICD-10-CM | POA: Diagnosis not present

## 2017-05-18 DIAGNOSIS — I110 Hypertensive heart disease with heart failure: Secondary | ICD-10-CM | POA: Diagnosis not present

## 2017-05-18 DIAGNOSIS — M1991 Primary osteoarthritis, unspecified site: Secondary | ICD-10-CM | POA: Diagnosis not present

## 2017-05-18 DIAGNOSIS — S4291XD Fracture of right shoulder girdle, part unspecified, subsequent encounter for fracture with routine healing: Secondary | ICD-10-CM | POA: Diagnosis not present

## 2017-06-04 DIAGNOSIS — J449 Chronic obstructive pulmonary disease, unspecified: Secondary | ICD-10-CM | POA: Diagnosis not present

## 2017-06-04 DIAGNOSIS — M1991 Primary osteoarthritis, unspecified site: Secondary | ICD-10-CM | POA: Diagnosis not present

## 2017-06-04 DIAGNOSIS — R2689 Other abnormalities of gait and mobility: Secondary | ICD-10-CM | POA: Diagnosis not present

## 2017-06-04 DIAGNOSIS — Z9181 History of falling: Secondary | ICD-10-CM | POA: Diagnosis not present

## 2017-06-04 DIAGNOSIS — M6281 Muscle weakness (generalized): Secondary | ICD-10-CM | POA: Diagnosis not present

## 2017-06-04 DIAGNOSIS — I509 Heart failure, unspecified: Secondary | ICD-10-CM | POA: Diagnosis not present

## 2017-06-06 DIAGNOSIS — M1991 Primary osteoarthritis, unspecified site: Secondary | ICD-10-CM | POA: Diagnosis not present

## 2017-06-06 DIAGNOSIS — J449 Chronic obstructive pulmonary disease, unspecified: Secondary | ICD-10-CM | POA: Diagnosis not present

## 2017-06-06 DIAGNOSIS — I509 Heart failure, unspecified: Secondary | ICD-10-CM | POA: Diagnosis not present

## 2017-06-06 DIAGNOSIS — M6281 Muscle weakness (generalized): Secondary | ICD-10-CM | POA: Diagnosis not present

## 2017-06-06 DIAGNOSIS — R2689 Other abnormalities of gait and mobility: Secondary | ICD-10-CM | POA: Diagnosis not present

## 2017-06-06 DIAGNOSIS — Z9181 History of falling: Secondary | ICD-10-CM | POA: Diagnosis not present

## 2017-06-12 DIAGNOSIS — R2689 Other abnormalities of gait and mobility: Secondary | ICD-10-CM | POA: Diagnosis not present

## 2017-06-12 DIAGNOSIS — I498 Other specified cardiac arrhythmias: Secondary | ICD-10-CM | POA: Diagnosis not present

## 2017-06-12 DIAGNOSIS — I509 Heart failure, unspecified: Secondary | ICD-10-CM | POA: Diagnosis not present

## 2017-06-12 DIAGNOSIS — Z9181 History of falling: Secondary | ICD-10-CM | POA: Diagnosis not present

## 2017-06-12 DIAGNOSIS — M6281 Muscle weakness (generalized): Secondary | ICD-10-CM | POA: Diagnosis not present

## 2017-06-12 DIAGNOSIS — I2510 Atherosclerotic heart disease of native coronary artery without angina pectoris: Secondary | ICD-10-CM | POA: Diagnosis not present

## 2017-06-12 DIAGNOSIS — J449 Chronic obstructive pulmonary disease, unspecified: Secondary | ICD-10-CM | POA: Diagnosis not present

## 2017-06-12 DIAGNOSIS — R0789 Other chest pain: Secondary | ICD-10-CM | POA: Diagnosis not present

## 2017-06-12 DIAGNOSIS — M1991 Primary osteoarthritis, unspecified site: Secondary | ICD-10-CM | POA: Diagnosis not present

## 2017-06-12 DIAGNOSIS — R0602 Shortness of breath: Secondary | ICD-10-CM | POA: Diagnosis not present

## 2017-06-12 DIAGNOSIS — I10 Essential (primary) hypertension: Secondary | ICD-10-CM | POA: Diagnosis not present

## 2017-06-17 DIAGNOSIS — R2689 Other abnormalities of gait and mobility: Secondary | ICD-10-CM | POA: Diagnosis not present

## 2017-06-17 DIAGNOSIS — M1991 Primary osteoarthritis, unspecified site: Secondary | ICD-10-CM | POA: Diagnosis not present

## 2017-06-17 DIAGNOSIS — M6281 Muscle weakness (generalized): Secondary | ICD-10-CM | POA: Diagnosis not present

## 2017-06-17 DIAGNOSIS — J449 Chronic obstructive pulmonary disease, unspecified: Secondary | ICD-10-CM | POA: Diagnosis not present

## 2017-06-17 DIAGNOSIS — I509 Heart failure, unspecified: Secondary | ICD-10-CM | POA: Diagnosis not present

## 2017-06-17 DIAGNOSIS — Z9181 History of falling: Secondary | ICD-10-CM | POA: Diagnosis not present

## 2017-06-20 DIAGNOSIS — M1991 Primary osteoarthritis, unspecified site: Secondary | ICD-10-CM | POA: Diagnosis not present

## 2017-06-20 DIAGNOSIS — Z9181 History of falling: Secondary | ICD-10-CM | POA: Diagnosis not present

## 2017-06-20 DIAGNOSIS — M6281 Muscle weakness (generalized): Secondary | ICD-10-CM | POA: Diagnosis not present

## 2017-06-20 DIAGNOSIS — R2689 Other abnormalities of gait and mobility: Secondary | ICD-10-CM | POA: Diagnosis not present

## 2017-06-20 DIAGNOSIS — I509 Heart failure, unspecified: Secondary | ICD-10-CM | POA: Diagnosis not present

## 2017-06-20 DIAGNOSIS — J449 Chronic obstructive pulmonary disease, unspecified: Secondary | ICD-10-CM | POA: Diagnosis not present

## 2017-06-27 DIAGNOSIS — Z9181 History of falling: Secondary | ICD-10-CM | POA: Diagnosis not present

## 2017-06-27 DIAGNOSIS — M6281 Muscle weakness (generalized): Secondary | ICD-10-CM | POA: Diagnosis not present

## 2017-06-27 DIAGNOSIS — I509 Heart failure, unspecified: Secondary | ICD-10-CM | POA: Diagnosis not present

## 2017-06-27 DIAGNOSIS — M1991 Primary osteoarthritis, unspecified site: Secondary | ICD-10-CM | POA: Diagnosis not present

## 2017-06-27 DIAGNOSIS — R2689 Other abnormalities of gait and mobility: Secondary | ICD-10-CM | POA: Diagnosis not present

## 2017-06-27 DIAGNOSIS — J449 Chronic obstructive pulmonary disease, unspecified: Secondary | ICD-10-CM | POA: Diagnosis not present

## 2017-06-29 DIAGNOSIS — I5033 Acute on chronic diastolic (congestive) heart failure: Secondary | ICD-10-CM | POA: Diagnosis not present

## 2017-06-29 DIAGNOSIS — K5904 Chronic idiopathic constipation: Secondary | ICD-10-CM | POA: Diagnosis not present

## 2017-06-29 DIAGNOSIS — Z79890 Hormone replacement therapy (postmenopausal): Secondary | ICD-10-CM | POA: Diagnosis not present

## 2017-06-29 DIAGNOSIS — R195 Other fecal abnormalities: Secondary | ICD-10-CM | POA: Diagnosis not present

## 2017-06-29 DIAGNOSIS — J449 Chronic obstructive pulmonary disease, unspecified: Secondary | ICD-10-CM | POA: Diagnosis not present

## 2017-06-29 DIAGNOSIS — I2510 Atherosclerotic heart disease of native coronary artery without angina pectoris: Secondary | ICD-10-CM | POA: Diagnosis not present

## 2017-06-29 DIAGNOSIS — K449 Diaphragmatic hernia without obstruction or gangrene: Secondary | ICD-10-CM | POA: Diagnosis not present

## 2017-06-29 DIAGNOSIS — J189 Pneumonia, unspecified organism: Secondary | ICD-10-CM | POA: Diagnosis not present

## 2017-06-29 DIAGNOSIS — Z7982 Long term (current) use of aspirin: Secondary | ICD-10-CM | POA: Diagnosis not present

## 2017-06-29 DIAGNOSIS — I495 Sick sinus syndrome: Secondary | ICD-10-CM | POA: Diagnosis not present

## 2017-06-29 DIAGNOSIS — I517 Cardiomegaly: Secondary | ICD-10-CM | POA: Diagnosis not present

## 2017-06-29 DIAGNOSIS — I10 Essential (primary) hypertension: Secondary | ICD-10-CM | POA: Diagnosis not present

## 2017-06-29 DIAGNOSIS — E039 Hypothyroidism, unspecified: Secondary | ICD-10-CM | POA: Diagnosis not present

## 2017-06-29 DIAGNOSIS — D500 Iron deficiency anemia secondary to blood loss (chronic): Secondary | ICD-10-CM | POA: Diagnosis not present

## 2017-06-29 DIAGNOSIS — Z950 Presence of cardiac pacemaker: Secondary | ICD-10-CM | POA: Diagnosis not present

## 2017-06-29 DIAGNOSIS — K922 Gastrointestinal hemorrhage, unspecified: Secondary | ICD-10-CM | POA: Diagnosis not present

## 2017-06-29 DIAGNOSIS — D62 Acute posthemorrhagic anemia: Secondary | ICD-10-CM | POA: Diagnosis not present

## 2017-06-29 DIAGNOSIS — Z91048 Other nonmedicinal substance allergy status: Secondary | ICD-10-CM | POA: Diagnosis not present

## 2017-06-29 DIAGNOSIS — D649 Anemia, unspecified: Secondary | ICD-10-CM | POA: Diagnosis not present

## 2017-06-29 DIAGNOSIS — R0602 Shortness of breath: Secondary | ICD-10-CM | POA: Diagnosis not present

## 2017-06-29 DIAGNOSIS — Z7722 Contact with and (suspected) exposure to environmental tobacco smoke (acute) (chronic): Secondary | ICD-10-CM | POA: Diagnosis present

## 2017-06-29 DIAGNOSIS — Z885 Allergy status to narcotic agent status: Secondary | ICD-10-CM | POA: Diagnosis not present

## 2017-06-29 DIAGNOSIS — Z86010 Personal history of colonic polyps: Secondary | ICD-10-CM | POA: Diagnosis not present

## 2017-06-29 DIAGNOSIS — I252 Old myocardial infarction: Secondary | ICD-10-CM | POA: Diagnosis not present

## 2017-06-29 DIAGNOSIS — K5909 Other constipation: Secondary | ICD-10-CM | POA: Diagnosis not present

## 2017-06-29 DIAGNOSIS — R918 Other nonspecific abnormal finding of lung field: Secondary | ICD-10-CM | POA: Diagnosis not present

## 2017-06-29 DIAGNOSIS — J9811 Atelectasis: Secondary | ICD-10-CM | POA: Diagnosis not present

## 2017-07-06 DIAGNOSIS — D649 Anemia, unspecified: Secondary | ICD-10-CM | POA: Diagnosis not present

## 2017-07-06 DIAGNOSIS — K922 Gastrointestinal hemorrhage, unspecified: Secondary | ICD-10-CM | POA: Diagnosis not present

## 2017-07-09 DIAGNOSIS — Z86010 Personal history of colonic polyps: Secondary | ICD-10-CM | POA: Diagnosis not present

## 2017-07-09 DIAGNOSIS — K5904 Chronic idiopathic constipation: Secondary | ICD-10-CM | POA: Diagnosis not present

## 2017-07-09 DIAGNOSIS — D509 Iron deficiency anemia, unspecified: Secondary | ICD-10-CM | POA: Diagnosis not present

## 2017-07-17 DIAGNOSIS — R0600 Dyspnea, unspecified: Secondary | ICD-10-CM | POA: Diagnosis not present

## 2017-07-17 DIAGNOSIS — I272 Pulmonary hypertension, unspecified: Secondary | ICD-10-CM | POA: Diagnosis not present

## 2017-07-17 DIAGNOSIS — R0602 Shortness of breath: Secondary | ICD-10-CM | POA: Diagnosis not present

## 2017-07-17 DIAGNOSIS — I082 Rheumatic disorders of both aortic and tricuspid valves: Secondary | ICD-10-CM | POA: Diagnosis not present

## 2017-07-21 DIAGNOSIS — Z885 Allergy status to narcotic agent status: Secondary | ICD-10-CM | POA: Diagnosis not present

## 2017-07-21 DIAGNOSIS — R0689 Other abnormalities of breathing: Secondary | ICD-10-CM | POA: Diagnosis not present

## 2017-07-21 DIAGNOSIS — Z886 Allergy status to analgesic agent status: Secondary | ICD-10-CM | POA: Diagnosis not present

## 2017-07-21 DIAGNOSIS — R918 Other nonspecific abnormal finding of lung field: Secondary | ICD-10-CM | POA: Diagnosis not present

## 2017-07-21 DIAGNOSIS — Z79899 Other long term (current) drug therapy: Secondary | ICD-10-CM | POA: Diagnosis not present

## 2017-07-21 DIAGNOSIS — Z950 Presence of cardiac pacemaker: Secondary | ICD-10-CM | POA: Diagnosis not present

## 2017-07-21 DIAGNOSIS — I509 Heart failure, unspecified: Secondary | ICD-10-CM | POA: Diagnosis not present

## 2017-07-21 DIAGNOSIS — R0600 Dyspnea, unspecified: Secondary | ICD-10-CM | POA: Diagnosis not present

## 2017-07-21 DIAGNOSIS — Z9981 Dependence on supplemental oxygen: Secondary | ICD-10-CM | POA: Diagnosis not present

## 2017-07-21 DIAGNOSIS — J449 Chronic obstructive pulmonary disease, unspecified: Secondary | ICD-10-CM | POA: Diagnosis not present

## 2017-07-21 DIAGNOSIS — K449 Diaphragmatic hernia without obstruction or gangrene: Secondary | ICD-10-CM | POA: Diagnosis not present

## 2017-07-21 DIAGNOSIS — M6281 Muscle weakness (generalized): Secondary | ICD-10-CM | POA: Diagnosis not present

## 2017-07-21 DIAGNOSIS — Y92129 Unspecified place in nursing home as the place of occurrence of the external cause: Secondary | ICD-10-CM | POA: Diagnosis not present

## 2017-07-21 DIAGNOSIS — R0602 Shortness of breath: Secondary | ICD-10-CM | POA: Diagnosis not present

## 2017-07-23 DIAGNOSIS — I509 Heart failure, unspecified: Secondary | ICD-10-CM | POA: Diagnosis not present

## 2017-07-24 DIAGNOSIS — R0602 Shortness of breath: Secondary | ICD-10-CM | POA: Diagnosis not present

## 2017-07-24 DIAGNOSIS — I5033 Acute on chronic diastolic (congestive) heart failure: Secondary | ICD-10-CM | POA: Diagnosis not present

## 2017-07-24 DIAGNOSIS — I498 Other specified cardiac arrhythmias: Secondary | ICD-10-CM | POA: Diagnosis not present

## 2017-07-24 DIAGNOSIS — I2510 Atherosclerotic heart disease of native coronary artery without angina pectoris: Secondary | ICD-10-CM | POA: Diagnosis not present

## 2017-07-24 DIAGNOSIS — I5032 Chronic diastolic (congestive) heart failure: Secondary | ICD-10-CM | POA: Diagnosis not present

## 2017-07-24 DIAGNOSIS — I10 Essential (primary) hypertension: Secondary | ICD-10-CM | POA: Diagnosis not present

## 2017-07-24 DIAGNOSIS — J449 Chronic obstructive pulmonary disease, unspecified: Secondary | ICD-10-CM | POA: Diagnosis not present

## 2017-07-24 DIAGNOSIS — J9611 Chronic respiratory failure with hypoxia: Secondary | ICD-10-CM | POA: Diagnosis not present

## 2017-07-30 DIAGNOSIS — Z45010 Encounter for checking and testing of cardiac pacemaker pulse generator [battery]: Secondary | ICD-10-CM | POA: Diagnosis not present

## 2017-07-30 DIAGNOSIS — I442 Atrioventricular block, complete: Secondary | ICD-10-CM | POA: Diagnosis not present

## 2017-07-30 DIAGNOSIS — I459 Conduction disorder, unspecified: Secondary | ICD-10-CM | POA: Diagnosis not present

## 2017-08-03 DIAGNOSIS — M2041 Other hammer toe(s) (acquired), right foot: Secondary | ICD-10-CM | POA: Diagnosis not present

## 2017-08-03 DIAGNOSIS — L600 Ingrowing nail: Secondary | ICD-10-CM | POA: Diagnosis not present

## 2017-08-03 DIAGNOSIS — B351 Tinea unguium: Secondary | ICD-10-CM | POA: Diagnosis not present

## 2017-08-03 DIAGNOSIS — M2042 Other hammer toe(s) (acquired), left foot: Secondary | ICD-10-CM | POA: Diagnosis not present

## 2017-08-03 DIAGNOSIS — M79674 Pain in right toe(s): Secondary | ICD-10-CM | POA: Diagnosis not present

## 2017-08-03 DIAGNOSIS — M79675 Pain in left toe(s): Secondary | ICD-10-CM | POA: Diagnosis not present

## 2017-08-07 DIAGNOSIS — D649 Anemia, unspecified: Secondary | ICD-10-CM | POA: Diagnosis not present

## 2017-08-13 DIAGNOSIS — Z45018 Encounter for adjustment and management of other part of cardiac pacemaker: Secondary | ICD-10-CM | POA: Diagnosis not present

## 2017-08-13 DIAGNOSIS — I442 Atrioventricular block, complete: Secondary | ICD-10-CM | POA: Diagnosis not present

## 2017-08-21 DIAGNOSIS — R600 Localized edema: Secondary | ICD-10-CM | POA: Diagnosis not present

## 2017-08-21 DIAGNOSIS — I2510 Atherosclerotic heart disease of native coronary artery without angina pectoris: Secondary | ICD-10-CM | POA: Diagnosis not present

## 2017-08-21 DIAGNOSIS — I10 Essential (primary) hypertension: Secondary | ICD-10-CM | POA: Diagnosis not present

## 2017-08-21 DIAGNOSIS — I5032 Chronic diastolic (congestive) heart failure: Secondary | ICD-10-CM | POA: Diagnosis not present

## 2017-08-21 DIAGNOSIS — R0602 Shortness of breath: Secondary | ICD-10-CM | POA: Diagnosis not present

## 2017-09-12 DIAGNOSIS — R0602 Shortness of breath: Secondary | ICD-10-CM | POA: Diagnosis not present

## 2017-09-12 DIAGNOSIS — I2510 Atherosclerotic heart disease of native coronary artery without angina pectoris: Secondary | ICD-10-CM | POA: Diagnosis not present

## 2017-09-12 DIAGNOSIS — I10 Essential (primary) hypertension: Secondary | ICD-10-CM | POA: Diagnosis not present

## 2017-09-12 DIAGNOSIS — I5032 Chronic diastolic (congestive) heart failure: Secondary | ICD-10-CM | POA: Diagnosis not present

## 2017-10-12 DIAGNOSIS — I252 Old myocardial infarction: Secondary | ICD-10-CM | POA: Diagnosis not present

## 2017-10-12 DIAGNOSIS — E785 Hyperlipidemia, unspecified: Secondary | ICD-10-CM | POA: Diagnosis present

## 2017-10-12 DIAGNOSIS — D62 Acute posthemorrhagic anemia: Secondary | ICD-10-CM | POA: Diagnosis not present

## 2017-10-12 DIAGNOSIS — I5032 Chronic diastolic (congestive) heart failure: Secondary | ICD-10-CM | POA: Diagnosis present

## 2017-10-12 DIAGNOSIS — L7632 Postprocedural hematoma of skin and subcutaneous tissue following other procedure: Secondary | ICD-10-CM | POA: Diagnosis not present

## 2017-10-12 DIAGNOSIS — E039 Hypothyroidism, unspecified: Secondary | ICD-10-CM | POA: Diagnosis present

## 2017-10-12 DIAGNOSIS — Z950 Presence of cardiac pacemaker: Secondary | ICD-10-CM | POA: Diagnosis not present

## 2017-10-12 DIAGNOSIS — I495 Sick sinus syndrome: Secondary | ICD-10-CM | POA: Diagnosis present

## 2017-10-12 DIAGNOSIS — I2510 Atherosclerotic heart disease of native coronary artery without angina pectoris: Secondary | ICD-10-CM | POA: Diagnosis not present

## 2017-10-12 DIAGNOSIS — J449 Chronic obstructive pulmonary disease, unspecified: Secondary | ICD-10-CM | POA: Diagnosis present

## 2017-10-12 DIAGNOSIS — I110 Hypertensive heart disease with heart failure: Secondary | ICD-10-CM | POA: Diagnosis present

## 2017-10-12 DIAGNOSIS — K449 Diaphragmatic hernia without obstruction or gangrene: Secondary | ICD-10-CM | POA: Diagnosis present

## 2017-10-12 DIAGNOSIS — I517 Cardiomegaly: Secondary | ICD-10-CM | POA: Diagnosis not present

## 2017-10-12 DIAGNOSIS — R1013 Epigastric pain: Secondary | ICD-10-CM | POA: Diagnosis not present

## 2017-10-12 DIAGNOSIS — Z9861 Coronary angioplasty status: Secondary | ICD-10-CM | POA: Diagnosis not present

## 2017-10-12 DIAGNOSIS — I214 Non-ST elevation (NSTEMI) myocardial infarction: Secondary | ICD-10-CM | POA: Diagnosis present

## 2017-10-12 DIAGNOSIS — I10 Essential (primary) hypertension: Secondary | ICD-10-CM | POA: Diagnosis not present

## 2017-10-12 DIAGNOSIS — D500 Iron deficiency anemia secondary to blood loss (chronic): Secondary | ICD-10-CM | POA: Diagnosis present

## 2017-10-12 DIAGNOSIS — Z885 Allergy status to narcotic agent status: Secondary | ICD-10-CM | POA: Diagnosis not present

## 2017-10-12 DIAGNOSIS — Z79890 Hormone replacement therapy (postmenopausal): Secondary | ICD-10-CM | POA: Diagnosis not present

## 2017-10-12 DIAGNOSIS — Z91048 Other nonmedicinal substance allergy status: Secondary | ICD-10-CM | POA: Diagnosis not present

## 2017-10-26 DIAGNOSIS — I97618 Postprocedural hemorrhage and hematoma of a circulatory system organ or structure following other circulatory system procedure: Secondary | ICD-10-CM | POA: Diagnosis not present

## 2017-10-26 DIAGNOSIS — Z79899 Other long term (current) drug therapy: Secondary | ICD-10-CM | POA: Diagnosis not present

## 2017-10-26 DIAGNOSIS — Z7982 Long term (current) use of aspirin: Secondary | ICD-10-CM | POA: Diagnosis not present

## 2017-10-26 DIAGNOSIS — Z7401 Bed confinement status: Secondary | ICD-10-CM | POA: Diagnosis not present

## 2017-10-26 DIAGNOSIS — Z91048 Other nonmedicinal substance allergy status: Secondary | ICD-10-CM | POA: Diagnosis not present

## 2017-10-26 DIAGNOSIS — Z9981 Dependence on supplemental oxygen: Secondary | ICD-10-CM | POA: Diagnosis not present

## 2017-10-26 DIAGNOSIS — I97630 Postprocedural hematoma of a circulatory system organ or structure following a cardiac catheterization: Secondary | ICD-10-CM | POA: Diagnosis not present

## 2017-10-26 DIAGNOSIS — I509 Heart failure, unspecified: Secondary | ICD-10-CM | POA: Diagnosis not present

## 2017-10-26 DIAGNOSIS — Z79890 Hormone replacement therapy (postmenopausal): Secondary | ICD-10-CM | POA: Diagnosis not present

## 2017-10-26 DIAGNOSIS — J449 Chronic obstructive pulmonary disease, unspecified: Secondary | ICD-10-CM | POA: Diagnosis not present

## 2017-10-26 DIAGNOSIS — Z885 Allergy status to narcotic agent status: Secondary | ICD-10-CM | POA: Diagnosis not present

## 2017-10-26 DIAGNOSIS — Z7951 Long term (current) use of inhaled steroids: Secondary | ICD-10-CM | POA: Diagnosis not present

## 2017-10-29 DIAGNOSIS — I5032 Chronic diastolic (congestive) heart failure: Secondary | ICD-10-CM | POA: Diagnosis not present

## 2017-10-29 DIAGNOSIS — Z4509 Encounter for adjustment and management of other cardiac device: Secondary | ICD-10-CM | POA: Diagnosis not present

## 2017-10-29 DIAGNOSIS — I442 Atrioventricular block, complete: Secondary | ICD-10-CM | POA: Diagnosis not present

## 2017-10-29 DIAGNOSIS — I10 Essential (primary) hypertension: Secondary | ICD-10-CM | POA: Diagnosis not present

## 2017-10-30 DIAGNOSIS — I10 Essential (primary) hypertension: Secondary | ICD-10-CM | POA: Diagnosis not present

## 2017-10-30 DIAGNOSIS — I5032 Chronic diastolic (congestive) heart failure: Secondary | ICD-10-CM | POA: Diagnosis not present

## 2017-10-30 DIAGNOSIS — I1310 Hypertensive heart and chronic kidney disease without heart failure, with stage 1 through stage 4 chronic kidney disease, or unspecified chronic kidney disease: Secondary | ICD-10-CM | POA: Diagnosis not present

## 2017-10-30 DIAGNOSIS — M1990 Unspecified osteoarthritis, unspecified site: Secondary | ICD-10-CM | POA: Diagnosis not present

## 2017-10-30 DIAGNOSIS — R0602 Shortness of breath: Secondary | ICD-10-CM | POA: Diagnosis not present

## 2017-10-30 DIAGNOSIS — I2510 Atherosclerotic heart disease of native coronary artery without angina pectoris: Secondary | ICD-10-CM | POA: Diagnosis not present

## 2017-10-30 DIAGNOSIS — E860 Dehydration: Secondary | ICD-10-CM | POA: Diagnosis not present

## 2017-10-30 DIAGNOSIS — N184 Chronic kidney disease, stage 4 (severe): Secondary | ICD-10-CM | POA: Diagnosis not present

## 2017-11-15 DIAGNOSIS — F419 Anxiety disorder, unspecified: Secondary | ICD-10-CM | POA: Diagnosis not present

## 2017-11-15 DIAGNOSIS — I5032 Chronic diastolic (congestive) heart failure: Secondary | ICD-10-CM | POA: Diagnosis not present

## 2017-11-15 DIAGNOSIS — J206 Acute bronchitis due to rhinovirus: Secondary | ICD-10-CM | POA: Diagnosis not present

## 2017-11-15 DIAGNOSIS — R0989 Other specified symptoms and signs involving the circulatory and respiratory systems: Secondary | ICD-10-CM | POA: Diagnosis not present

## 2017-11-19 DIAGNOSIS — H401133 Primary open-angle glaucoma, bilateral, severe stage: Secondary | ICD-10-CM | POA: Diagnosis not present

## 2017-11-19 DIAGNOSIS — H02411 Mechanical ptosis of right eyelid: Secondary | ICD-10-CM | POA: Diagnosis not present

## 2017-11-19 DIAGNOSIS — H43393 Other vitreous opacities, bilateral: Secondary | ICD-10-CM | POA: Diagnosis not present

## 2017-11-19 DIAGNOSIS — H02412 Mechanical ptosis of left eyelid: Secondary | ICD-10-CM | POA: Diagnosis not present

## 2017-11-28 DIAGNOSIS — Z23 Encounter for immunization: Secondary | ICD-10-CM | POA: Diagnosis not present

## 2017-11-30 DIAGNOSIS — M79674 Pain in right toe(s): Secondary | ICD-10-CM | POA: Diagnosis not present

## 2017-11-30 DIAGNOSIS — L600 Ingrowing nail: Secondary | ICD-10-CM | POA: Diagnosis not present

## 2017-11-30 DIAGNOSIS — M2041 Other hammer toe(s) (acquired), right foot: Secondary | ICD-10-CM | POA: Diagnosis not present

## 2017-11-30 DIAGNOSIS — B351 Tinea unguium: Secondary | ICD-10-CM | POA: Diagnosis not present

## 2017-11-30 DIAGNOSIS — M79675 Pain in left toe(s): Secondary | ICD-10-CM | POA: Diagnosis not present

## 2017-11-30 DIAGNOSIS — M2042 Other hammer toe(s) (acquired), left foot: Secondary | ICD-10-CM | POA: Diagnosis not present

## 2017-12-03 DIAGNOSIS — E782 Mixed hyperlipidemia: Secondary | ICD-10-CM | POA: Diagnosis not present

## 2017-12-03 DIAGNOSIS — N184 Chronic kidney disease, stage 4 (severe): Secondary | ICD-10-CM | POA: Diagnosis not present

## 2017-12-03 DIAGNOSIS — I2510 Atherosclerotic heart disease of native coronary artery without angina pectoris: Secondary | ICD-10-CM | POA: Diagnosis not present

## 2017-12-03 DIAGNOSIS — I10 Essential (primary) hypertension: Secondary | ICD-10-CM | POA: Diagnosis not present

## 2017-12-03 DIAGNOSIS — R0602 Shortness of breath: Secondary | ICD-10-CM | POA: Diagnosis not present

## 2017-12-03 DIAGNOSIS — I5032 Chronic diastolic (congestive) heart failure: Secondary | ICD-10-CM | POA: Diagnosis not present

## 2017-12-03 DIAGNOSIS — I1310 Hypertensive heart and chronic kidney disease without heart failure, with stage 1 through stage 4 chronic kidney disease, or unspecified chronic kidney disease: Secondary | ICD-10-CM | POA: Diagnosis not present

## 2017-12-20 DIAGNOSIS — J449 Chronic obstructive pulmonary disease, unspecified: Secondary | ICD-10-CM | POA: Diagnosis not present

## 2017-12-20 DIAGNOSIS — I5032 Chronic diastolic (congestive) heart failure: Secondary | ICD-10-CM | POA: Diagnosis not present

## 2017-12-20 DIAGNOSIS — E782 Mixed hyperlipidemia: Secondary | ICD-10-CM | POA: Diagnosis not present

## 2017-12-20 DIAGNOSIS — F419 Anxiety disorder, unspecified: Secondary | ICD-10-CM | POA: Diagnosis not present

## 2017-12-24 DIAGNOSIS — I2510 Atherosclerotic heart disease of native coronary artery without angina pectoris: Secondary | ICD-10-CM | POA: Diagnosis not present

## 2017-12-24 DIAGNOSIS — Z955 Presence of coronary angioplasty implant and graft: Secondary | ICD-10-CM | POA: Diagnosis not present

## 2017-12-26 DIAGNOSIS — I2510 Atherosclerotic heart disease of native coronary artery without angina pectoris: Secondary | ICD-10-CM | POA: Diagnosis not present

## 2017-12-26 DIAGNOSIS — Z955 Presence of coronary angioplasty implant and graft: Secondary | ICD-10-CM | POA: Diagnosis not present

## 2017-12-31 DIAGNOSIS — I2510 Atherosclerotic heart disease of native coronary artery without angina pectoris: Secondary | ICD-10-CM | POA: Diagnosis not present

## 2017-12-31 DIAGNOSIS — Z955 Presence of coronary angioplasty implant and graft: Secondary | ICD-10-CM | POA: Diagnosis not present

## 2018-01-02 DIAGNOSIS — Z955 Presence of coronary angioplasty implant and graft: Secondary | ICD-10-CM | POA: Diagnosis not present

## 2018-01-02 DIAGNOSIS — I2510 Atherosclerotic heart disease of native coronary artery without angina pectoris: Secondary | ICD-10-CM | POA: Diagnosis not present

## 2018-01-07 DIAGNOSIS — I2510 Atherosclerotic heart disease of native coronary artery without angina pectoris: Secondary | ICD-10-CM | POA: Diagnosis not present

## 2018-01-07 DIAGNOSIS — Z955 Presence of coronary angioplasty implant and graft: Secondary | ICD-10-CM | POA: Diagnosis not present

## 2018-01-14 DIAGNOSIS — I2510 Atherosclerotic heart disease of native coronary artery without angina pectoris: Secondary | ICD-10-CM | POA: Diagnosis not present

## 2018-01-14 DIAGNOSIS — Z955 Presence of coronary angioplasty implant and graft: Secondary | ICD-10-CM | POA: Diagnosis not present

## 2018-01-15 DIAGNOSIS — I10 Essential (primary) hypertension: Secondary | ICD-10-CM | POA: Diagnosis present

## 2018-01-15 DIAGNOSIS — R0602 Shortness of breath: Secondary | ICD-10-CM | POA: Diagnosis not present

## 2018-01-15 DIAGNOSIS — S01552A Open bite of oral cavity, initial encounter: Secondary | ICD-10-CM | POA: Diagnosis present

## 2018-01-15 DIAGNOSIS — Z950 Presence of cardiac pacemaker: Secondary | ICD-10-CM | POA: Diagnosis not present

## 2018-01-15 DIAGNOSIS — K8689 Other specified diseases of pancreas: Secondary | ICD-10-CM | POA: Diagnosis not present

## 2018-01-15 DIAGNOSIS — R296 Repeated falls: Secondary | ICD-10-CM | POA: Diagnosis not present

## 2018-01-15 DIAGNOSIS — I130 Hypertensive heart and chronic kidney disease with heart failure and stage 1 through stage 4 chronic kidney disease, or unspecified chronic kidney disease: Secondary | ICD-10-CM | POA: Diagnosis present

## 2018-01-15 DIAGNOSIS — R509 Fever, unspecified: Secondary | ICD-10-CM | POA: Diagnosis not present

## 2018-01-15 DIAGNOSIS — R109 Unspecified abdominal pain: Secondary | ICD-10-CM | POA: Diagnosis not present

## 2018-01-15 DIAGNOSIS — J449 Chronic obstructive pulmonary disease, unspecified: Secondary | ICD-10-CM | POA: Diagnosis not present

## 2018-01-15 DIAGNOSIS — Z7982 Long term (current) use of aspirin: Secondary | ICD-10-CM | POA: Diagnosis not present

## 2018-01-15 DIAGNOSIS — N189 Chronic kidney disease, unspecified: Secondary | ICD-10-CM | POA: Diagnosis not present

## 2018-01-15 DIAGNOSIS — Z859 Personal history of malignant neoplasm, unspecified: Secondary | ICD-10-CM | POA: Diagnosis not present

## 2018-01-15 DIAGNOSIS — I700 Atherosclerosis of aorta: Secondary | ICD-10-CM | POA: Diagnosis not present

## 2018-01-15 DIAGNOSIS — K5900 Constipation, unspecified: Secondary | ICD-10-CM | POA: Diagnosis not present

## 2018-01-15 DIAGNOSIS — Z885 Allergy status to narcotic agent status: Secondary | ICD-10-CM | POA: Diagnosis not present

## 2018-01-15 DIAGNOSIS — Z7902 Long term (current) use of antithrombotics/antiplatelets: Secondary | ICD-10-CM | POA: Diagnosis not present

## 2018-01-15 DIAGNOSIS — J189 Pneumonia, unspecified organism: Secondary | ICD-10-CM | POA: Diagnosis not present

## 2018-01-15 DIAGNOSIS — Z886 Allergy status to analgesic agent status: Secondary | ICD-10-CM | POA: Diagnosis not present

## 2018-01-15 DIAGNOSIS — Z79890 Hormone replacement therapy (postmenopausal): Secondary | ICD-10-CM | POA: Diagnosis not present

## 2018-01-15 DIAGNOSIS — I509 Heart failure, unspecified: Secondary | ICD-10-CM | POA: Diagnosis present

## 2018-01-15 DIAGNOSIS — N183 Chronic kidney disease, stage 3 (moderate): Secondary | ICD-10-CM | POA: Diagnosis present

## 2018-01-15 DIAGNOSIS — Z7401 Bed confinement status: Secondary | ICD-10-CM | POA: Diagnosis not present

## 2018-01-15 DIAGNOSIS — I495 Sick sinus syndrome: Secondary | ICD-10-CM | POA: Diagnosis not present

## 2018-01-15 DIAGNOSIS — E876 Hypokalemia: Secondary | ICD-10-CM | POA: Diagnosis not present

## 2018-01-15 DIAGNOSIS — K449 Diaphragmatic hernia without obstruction or gangrene: Secondary | ICD-10-CM | POA: Diagnosis not present

## 2018-01-15 DIAGNOSIS — I2510 Atherosclerotic heart disease of native coronary artery without angina pectoris: Secondary | ICD-10-CM | POA: Diagnosis present

## 2018-01-15 DIAGNOSIS — Z91048 Other nonmedicinal substance allergy status: Secondary | ICD-10-CM | POA: Diagnosis not present

## 2018-01-15 DIAGNOSIS — M419 Scoliosis, unspecified: Secondary | ICD-10-CM | POA: Diagnosis present

## 2018-01-15 DIAGNOSIS — R0600 Dyspnea, unspecified: Secondary | ICD-10-CM | POA: Diagnosis not present

## 2018-01-15 DIAGNOSIS — E039 Hypothyroidism, unspecified: Secondary | ICD-10-CM | POA: Diagnosis not present

## 2018-01-15 DIAGNOSIS — Z9981 Dependence on supplemental oxygen: Secondary | ICD-10-CM | POA: Diagnosis not present

## 2018-01-15 DIAGNOSIS — Z66 Do not resuscitate: Secondary | ICD-10-CM | POA: Diagnosis present

## 2018-01-15 DIAGNOSIS — R112 Nausea with vomiting, unspecified: Secondary | ICD-10-CM | POA: Diagnosis not present

## 2018-01-15 DIAGNOSIS — Y95 Nosocomial condition: Secondary | ICD-10-CM | POA: Diagnosis not present

## 2018-01-15 DIAGNOSIS — I129 Hypertensive chronic kidney disease with stage 1 through stage 4 chronic kidney disease, or unspecified chronic kidney disease: Secondary | ICD-10-CM | POA: Diagnosis present

## 2018-01-21 DIAGNOSIS — I2510 Atherosclerotic heart disease of native coronary artery without angina pectoris: Secondary | ICD-10-CM | POA: Diagnosis not present

## 2018-01-21 DIAGNOSIS — Z955 Presence of coronary angioplasty implant and graft: Secondary | ICD-10-CM | POA: Diagnosis not present

## 2018-01-28 DIAGNOSIS — Z45010 Encounter for checking and testing of cardiac pacemaker pulse generator [battery]: Secondary | ICD-10-CM | POA: Diagnosis not present

## 2018-01-28 DIAGNOSIS — Z45018 Encounter for adjustment and management of other part of cardiac pacemaker: Secondary | ICD-10-CM | POA: Diagnosis not present

## 2018-01-28 DIAGNOSIS — Z955 Presence of coronary angioplasty implant and graft: Secondary | ICD-10-CM | POA: Diagnosis not present

## 2018-01-28 DIAGNOSIS — I442 Atrioventricular block, complete: Secondary | ICD-10-CM | POA: Diagnosis not present

## 2018-01-28 DIAGNOSIS — I2510 Atherosclerotic heart disease of native coronary artery without angina pectoris: Secondary | ICD-10-CM | POA: Diagnosis not present

## 2018-02-04 DIAGNOSIS — R0602 Shortness of breath: Secondary | ICD-10-CM | POA: Diagnosis not present

## 2018-02-04 DIAGNOSIS — I2510 Atherosclerotic heart disease of native coronary artery without angina pectoris: Secondary | ICD-10-CM | POA: Diagnosis not present

## 2018-02-04 DIAGNOSIS — R0600 Dyspnea, unspecified: Secondary | ICD-10-CM | POA: Diagnosis not present

## 2018-02-04 DIAGNOSIS — Z955 Presence of coronary angioplasty implant and graft: Secondary | ICD-10-CM | POA: Diagnosis not present

## 2018-02-04 DIAGNOSIS — J449 Chronic obstructive pulmonary disease, unspecified: Secondary | ICD-10-CM | POA: Diagnosis not present

## 2018-02-06 DIAGNOSIS — I2510 Atherosclerotic heart disease of native coronary artery without angina pectoris: Secondary | ICD-10-CM | POA: Diagnosis not present

## 2018-02-06 DIAGNOSIS — D649 Anemia, unspecified: Secondary | ICD-10-CM | POA: Diagnosis not present

## 2018-02-06 DIAGNOSIS — Z955 Presence of coronary angioplasty implant and graft: Secondary | ICD-10-CM | POA: Diagnosis not present

## 2018-02-08 DIAGNOSIS — J9611 Chronic respiratory failure with hypoxia: Secondary | ICD-10-CM | POA: Diagnosis not present

## 2018-02-08 DIAGNOSIS — I219 Acute myocardial infarction, unspecified: Secondary | ICD-10-CM | POA: Diagnosis not present

## 2018-02-08 DIAGNOSIS — J449 Chronic obstructive pulmonary disease, unspecified: Secondary | ICD-10-CM | POA: Diagnosis not present

## 2018-02-13 DIAGNOSIS — Z955 Presence of coronary angioplasty implant and graft: Secondary | ICD-10-CM | POA: Diagnosis not present

## 2018-02-13 DIAGNOSIS — I2510 Atherosclerotic heart disease of native coronary artery without angina pectoris: Secondary | ICD-10-CM | POA: Diagnosis not present

## 2018-02-18 DIAGNOSIS — I495 Sick sinus syndrome: Secondary | ICD-10-CM | POA: Diagnosis not present

## 2018-02-18 DIAGNOSIS — Z950 Presence of cardiac pacemaker: Secondary | ICD-10-CM | POA: Diagnosis not present

## 2018-02-20 DIAGNOSIS — I2510 Atherosclerotic heart disease of native coronary artery without angina pectoris: Secondary | ICD-10-CM | POA: Diagnosis not present

## 2018-02-20 DIAGNOSIS — Z955 Presence of coronary angioplasty implant and graft: Secondary | ICD-10-CM | POA: Diagnosis not present

## 2018-02-22 DIAGNOSIS — J9611 Chronic respiratory failure with hypoxia: Secondary | ICD-10-CM | POA: Diagnosis not present

## 2018-02-22 DIAGNOSIS — R0609 Other forms of dyspnea: Secondary | ICD-10-CM | POA: Diagnosis not present

## 2018-02-22 DIAGNOSIS — J449 Chronic obstructive pulmonary disease, unspecified: Secondary | ICD-10-CM | POA: Diagnosis not present

## 2018-02-22 DIAGNOSIS — R0600 Dyspnea, unspecified: Secondary | ICD-10-CM | POA: Diagnosis not present

## 2018-02-22 DIAGNOSIS — R0602 Shortness of breath: Secondary | ICD-10-CM | POA: Diagnosis not present

## 2018-02-25 DIAGNOSIS — Z955 Presence of coronary angioplasty implant and graft: Secondary | ICD-10-CM | POA: Diagnosis not present

## 2018-02-25 DIAGNOSIS — I2510 Atherosclerotic heart disease of native coronary artery without angina pectoris: Secondary | ICD-10-CM | POA: Diagnosis not present

## 2018-02-26 DIAGNOSIS — H01004 Unspecified blepharitis left upper eyelid: Secondary | ICD-10-CM | POA: Diagnosis not present

## 2018-02-26 DIAGNOSIS — H16143 Punctate keratitis, bilateral: Secondary | ICD-10-CM | POA: Diagnosis not present

## 2018-02-26 DIAGNOSIS — H01001 Unspecified blepharitis right upper eyelid: Secondary | ICD-10-CM | POA: Diagnosis not present

## 2018-03-04 DIAGNOSIS — I219 Acute myocardial infarction, unspecified: Secondary | ICD-10-CM | POA: Diagnosis not present

## 2018-03-06 DIAGNOSIS — I219 Acute myocardial infarction, unspecified: Secondary | ICD-10-CM | POA: Diagnosis not present

## 2018-03-11 DIAGNOSIS — I219 Acute myocardial infarction, unspecified: Secondary | ICD-10-CM | POA: Diagnosis not present

## 2018-03-18 DIAGNOSIS — I219 Acute myocardial infarction, unspecified: Secondary | ICD-10-CM | POA: Diagnosis not present

## 2018-03-19 DIAGNOSIS — R05 Cough: Secondary | ICD-10-CM | POA: Diagnosis not present

## 2018-03-19 DIAGNOSIS — R0602 Shortness of breath: Secondary | ICD-10-CM | POA: Diagnosis not present

## 2018-03-19 DIAGNOSIS — J441 Chronic obstructive pulmonary disease with (acute) exacerbation: Secondary | ICD-10-CM | POA: Diagnosis not present

## 2018-03-19 DIAGNOSIS — J209 Acute bronchitis, unspecified: Secondary | ICD-10-CM | POA: Diagnosis not present

## 2018-03-20 DIAGNOSIS — H01112 Allergic dermatitis of right lower eyelid: Secondary | ICD-10-CM | POA: Diagnosis not present

## 2018-03-20 DIAGNOSIS — H01115 Allergic dermatitis of left lower eyelid: Secondary | ICD-10-CM | POA: Diagnosis not present

## 2018-03-20 DIAGNOSIS — H01114 Allergic dermatitis of left upper eyelid: Secondary | ICD-10-CM | POA: Diagnosis not present

## 2018-03-20 DIAGNOSIS — H01111 Allergic dermatitis of right upper eyelid: Secondary | ICD-10-CM | POA: Diagnosis not present

## 2018-03-20 DIAGNOSIS — I219 Acute myocardial infarction, unspecified: Secondary | ICD-10-CM | POA: Diagnosis not present

## 2018-03-25 DIAGNOSIS — I219 Acute myocardial infarction, unspecified: Secondary | ICD-10-CM | POA: Diagnosis not present

## 2018-03-27 DIAGNOSIS — H01112 Allergic dermatitis of right lower eyelid: Secondary | ICD-10-CM | POA: Diagnosis not present

## 2018-03-27 DIAGNOSIS — H01111 Allergic dermatitis of right upper eyelid: Secondary | ICD-10-CM | POA: Diagnosis not present

## 2018-03-27 DIAGNOSIS — H01115 Allergic dermatitis of left lower eyelid: Secondary | ICD-10-CM | POA: Diagnosis not present

## 2018-03-27 DIAGNOSIS — H01114 Allergic dermatitis of left upper eyelid: Secondary | ICD-10-CM | POA: Diagnosis not present

## 2018-04-01 DIAGNOSIS — I2510 Atherosclerotic heart disease of native coronary artery without angina pectoris: Secondary | ICD-10-CM | POA: Diagnosis not present

## 2018-04-01 DIAGNOSIS — Z955 Presence of coronary angioplasty implant and graft: Secondary | ICD-10-CM | POA: Diagnosis not present

## 2018-04-05 DIAGNOSIS — M2041 Other hammer toe(s) (acquired), right foot: Secondary | ICD-10-CM | POA: Diagnosis not present

## 2018-04-05 DIAGNOSIS — M2042 Other hammer toe(s) (acquired), left foot: Secondary | ICD-10-CM | POA: Diagnosis not present

## 2018-04-05 DIAGNOSIS — M79674 Pain in right toe(s): Secondary | ICD-10-CM | POA: Diagnosis not present

## 2018-04-05 DIAGNOSIS — M79675 Pain in left toe(s): Secondary | ICD-10-CM | POA: Diagnosis not present

## 2018-04-05 DIAGNOSIS — L600 Ingrowing nail: Secondary | ICD-10-CM | POA: Diagnosis not present

## 2018-04-05 DIAGNOSIS — B351 Tinea unguium: Secondary | ICD-10-CM | POA: Diagnosis not present

## 2018-04-16 DIAGNOSIS — I25118 Atherosclerotic heart disease of native coronary artery with other forms of angina pectoris: Secondary | ICD-10-CM | POA: Diagnosis not present

## 2018-04-16 DIAGNOSIS — I5032 Chronic diastolic (congestive) heart failure: Secondary | ICD-10-CM | POA: Diagnosis not present

## 2018-04-16 DIAGNOSIS — I10 Essential (primary) hypertension: Secondary | ICD-10-CM | POA: Diagnosis not present

## 2018-04-16 DIAGNOSIS — I495 Sick sinus syndrome: Secondary | ICD-10-CM | POA: Diagnosis not present

## 2018-04-16 DIAGNOSIS — E782 Mixed hyperlipidemia: Secondary | ICD-10-CM | POA: Diagnosis not present

## 2018-04-29 DIAGNOSIS — I442 Atrioventricular block, complete: Secondary | ICD-10-CM | POA: Diagnosis not present

## 2018-04-29 DIAGNOSIS — Z462 Encounter for fitting and adjustment of other devices related to nervous system and special senses: Secondary | ICD-10-CM | POA: Diagnosis not present

## 2018-04-29 DIAGNOSIS — I459 Conduction disorder, unspecified: Secondary | ICD-10-CM | POA: Diagnosis not present

## 2018-04-29 DIAGNOSIS — Z950 Presence of cardiac pacemaker: Secondary | ICD-10-CM | POA: Diagnosis not present

## 2018-05-21 DIAGNOSIS — Z0000 Encounter for general adult medical examination without abnormal findings: Secondary | ICD-10-CM | POA: Diagnosis not present

## 2018-07-29 DIAGNOSIS — I442 Atrioventricular block, complete: Secondary | ICD-10-CM | POA: Diagnosis not present

## 2018-07-29 DIAGNOSIS — Z950 Presence of cardiac pacemaker: Secondary | ICD-10-CM | POA: Diagnosis not present

## 2018-07-29 DIAGNOSIS — Z4509 Encounter for adjustment and management of other cardiac device: Secondary | ICD-10-CM | POA: Diagnosis not present

## 2018-08-16 DIAGNOSIS — M79675 Pain in left toe(s): Secondary | ICD-10-CM | POA: Diagnosis not present

## 2018-08-16 DIAGNOSIS — M2042 Other hammer toe(s) (acquired), left foot: Secondary | ICD-10-CM | POA: Diagnosis not present

## 2018-08-16 DIAGNOSIS — M79674 Pain in right toe(s): Secondary | ICD-10-CM | POA: Diagnosis not present

## 2018-08-16 DIAGNOSIS — M2041 Other hammer toe(s) (acquired), right foot: Secondary | ICD-10-CM | POA: Diagnosis not present

## 2018-08-16 DIAGNOSIS — L600 Ingrowing nail: Secondary | ICD-10-CM | POA: Diagnosis not present

## 2018-08-16 DIAGNOSIS — B351 Tinea unguium: Secondary | ICD-10-CM | POA: Diagnosis not present

## 2018-08-22 DIAGNOSIS — I2510 Atherosclerotic heart disease of native coronary artery without angina pectoris: Secondary | ICD-10-CM | POA: Diagnosis not present

## 2018-08-22 DIAGNOSIS — I5032 Chronic diastolic (congestive) heart failure: Secondary | ICD-10-CM | POA: Diagnosis not present

## 2018-08-22 DIAGNOSIS — R600 Localized edema: Secondary | ICD-10-CM | POA: Diagnosis not present

## 2018-08-22 DIAGNOSIS — E782 Mixed hyperlipidemia: Secondary | ICD-10-CM | POA: Diagnosis not present

## 2018-08-22 DIAGNOSIS — R0600 Dyspnea, unspecified: Secondary | ICD-10-CM | POA: Diagnosis not present

## 2018-08-22 DIAGNOSIS — I495 Sick sinus syndrome: Secondary | ICD-10-CM | POA: Diagnosis not present

## 2018-08-22 DIAGNOSIS — I10 Essential (primary) hypertension: Secondary | ICD-10-CM | POA: Diagnosis not present

## 2018-08-26 DIAGNOSIS — I495 Sick sinus syndrome: Secondary | ICD-10-CM | POA: Diagnosis not present

## 2018-08-26 DIAGNOSIS — Z45010 Encounter for checking and testing of cardiac pacemaker pulse generator [battery]: Secondary | ICD-10-CM | POA: Diagnosis not present

## 2018-09-26 DIAGNOSIS — H01111 Allergic dermatitis of right upper eyelid: Secondary | ICD-10-CM | POA: Diagnosis not present

## 2018-09-26 DIAGNOSIS — H01115 Allergic dermatitis of left lower eyelid: Secondary | ICD-10-CM | POA: Diagnosis not present

## 2018-09-26 DIAGNOSIS — H01114 Allergic dermatitis of left upper eyelid: Secondary | ICD-10-CM | POA: Diagnosis not present

## 2018-09-26 DIAGNOSIS — H01112 Allergic dermatitis of right lower eyelid: Secondary | ICD-10-CM | POA: Diagnosis not present

## 2018-10-08 DIAGNOSIS — M1990 Unspecified osteoarthritis, unspecified site: Secondary | ICD-10-CM | POA: Diagnosis not present

## 2018-10-08 DIAGNOSIS — E039 Hypothyroidism, unspecified: Secondary | ICD-10-CM | POA: Diagnosis not present

## 2018-10-08 DIAGNOSIS — G4700 Insomnia, unspecified: Secondary | ICD-10-CM | POA: Diagnosis not present

## 2018-10-08 DIAGNOSIS — I4891 Unspecified atrial fibrillation: Secondary | ICD-10-CM | POA: Diagnosis not present

## 2018-10-28 DIAGNOSIS — Z45010 Encounter for checking and testing of cardiac pacemaker pulse generator [battery]: Secondary | ICD-10-CM | POA: Diagnosis not present

## 2018-10-28 DIAGNOSIS — I442 Atrioventricular block, complete: Secondary | ICD-10-CM | POA: Diagnosis not present

## 2018-10-28 DIAGNOSIS — Z4502 Encounter for adjustment and management of automatic implantable cardiac defibrillator: Secondary | ICD-10-CM | POA: Diagnosis not present

## 2018-10-28 DIAGNOSIS — Z950 Presence of cardiac pacemaker: Secondary | ICD-10-CM | POA: Diagnosis not present

## 2018-10-28 DIAGNOSIS — I459 Conduction disorder, unspecified: Secondary | ICD-10-CM | POA: Diagnosis not present

## 2018-11-18 DIAGNOSIS — M1990 Unspecified osteoarthritis, unspecified site: Secondary | ICD-10-CM | POA: Diagnosis not present

## 2018-11-25 DIAGNOSIS — E782 Mixed hyperlipidemia: Secondary | ICD-10-CM | POA: Diagnosis not present

## 2018-11-25 DIAGNOSIS — I495 Sick sinus syndrome: Secondary | ICD-10-CM | POA: Diagnosis not present

## 2018-11-25 DIAGNOSIS — I5032 Chronic diastolic (congestive) heart failure: Secondary | ICD-10-CM | POA: Diagnosis not present

## 2018-11-25 DIAGNOSIS — I2510 Atherosclerotic heart disease of native coronary artery without angina pectoris: Secondary | ICD-10-CM | POA: Diagnosis not present

## 2018-11-25 DIAGNOSIS — I083 Combined rheumatic disorders of mitral, aortic and tricuspid valves: Secondary | ICD-10-CM | POA: Diagnosis not present

## 2018-11-25 DIAGNOSIS — I10 Essential (primary) hypertension: Secondary | ICD-10-CM | POA: Diagnosis not present

## 2018-12-06 DIAGNOSIS — M2042 Other hammer toe(s) (acquired), left foot: Secondary | ICD-10-CM | POA: Diagnosis not present

## 2018-12-06 DIAGNOSIS — M79674 Pain in right toe(s): Secondary | ICD-10-CM | POA: Diagnosis not present

## 2018-12-06 DIAGNOSIS — M79675 Pain in left toe(s): Secondary | ICD-10-CM | POA: Diagnosis not present

## 2018-12-06 DIAGNOSIS — L600 Ingrowing nail: Secondary | ICD-10-CM | POA: Diagnosis not present

## 2018-12-06 DIAGNOSIS — B351 Tinea unguium: Secondary | ICD-10-CM | POA: Diagnosis not present

## 2018-12-06 DIAGNOSIS — M2041 Other hammer toe(s) (acquired), right foot: Secondary | ICD-10-CM | POA: Diagnosis not present

## 2018-12-18 DIAGNOSIS — H401133 Primary open-angle glaucoma, bilateral, severe stage: Secondary | ICD-10-CM | POA: Diagnosis not present

## 2018-12-18 DIAGNOSIS — H01004 Unspecified blepharitis left upper eyelid: Secondary | ICD-10-CM | POA: Diagnosis not present

## 2018-12-18 DIAGNOSIS — H01001 Unspecified blepharitis right upper eyelid: Secondary | ICD-10-CM | POA: Diagnosis not present

## 2018-12-18 DIAGNOSIS — H524 Presbyopia: Secondary | ICD-10-CM | POA: Diagnosis not present

## 2019-01-02 DIAGNOSIS — M1990 Unspecified osteoarthritis, unspecified site: Secondary | ICD-10-CM | POA: Diagnosis not present

## 2019-02-21 DIAGNOSIS — L600 Ingrowing nail: Secondary | ICD-10-CM | POA: Diagnosis not present

## 2019-02-21 DIAGNOSIS — B351 Tinea unguium: Secondary | ICD-10-CM | POA: Diagnosis not present

## 2019-02-21 DIAGNOSIS — M79675 Pain in left toe(s): Secondary | ICD-10-CM | POA: Diagnosis not present

## 2019-02-21 DIAGNOSIS — M79674 Pain in right toe(s): Secondary | ICD-10-CM | POA: Diagnosis not present

## 2019-02-21 DIAGNOSIS — M2042 Other hammer toe(s) (acquired), left foot: Secondary | ICD-10-CM | POA: Diagnosis not present

## 2019-02-21 DIAGNOSIS — M2041 Other hammer toe(s) (acquired), right foot: Secondary | ICD-10-CM | POA: Diagnosis not present

## 2019-03-01 DIAGNOSIS — Z23 Encounter for immunization: Secondary | ICD-10-CM | POA: Diagnosis not present

## 2019-03-05 DIAGNOSIS — M1990 Unspecified osteoarthritis, unspecified site: Secondary | ICD-10-CM | POA: Diagnosis not present

## 2019-03-17 DIAGNOSIS — Z45010 Encounter for checking and testing of cardiac pacemaker pulse generator [battery]: Secondary | ICD-10-CM | POA: Diagnosis not present

## 2019-03-17 DIAGNOSIS — I495 Sick sinus syndrome: Secondary | ICD-10-CM | POA: Diagnosis not present

## 2019-03-22 DIAGNOSIS — Z23 Encounter for immunization: Secondary | ICD-10-CM | POA: Diagnosis not present

## 2019-03-25 DIAGNOSIS — H1131 Conjunctival hemorrhage, right eye: Secondary | ICD-10-CM | POA: Diagnosis not present

## 2019-03-31 DIAGNOSIS — I5032 Chronic diastolic (congestive) heart failure: Secondary | ICD-10-CM | POA: Diagnosis not present

## 2019-03-31 DIAGNOSIS — R0600 Dyspnea, unspecified: Secondary | ICD-10-CM | POA: Diagnosis not present

## 2019-03-31 DIAGNOSIS — I10 Essential (primary) hypertension: Secondary | ICD-10-CM | POA: Diagnosis not present

## 2019-03-31 DIAGNOSIS — E782 Mixed hyperlipidemia: Secondary | ICD-10-CM | POA: Diagnosis not present

## 2019-03-31 DIAGNOSIS — I2510 Atherosclerotic heart disease of native coronary artery without angina pectoris: Secondary | ICD-10-CM | POA: Diagnosis not present

## 2019-03-31 DIAGNOSIS — I495 Sick sinus syndrome: Secondary | ICD-10-CM | POA: Diagnosis not present

## 2019-04-14 DIAGNOSIS — H401133 Primary open-angle glaucoma, bilateral, severe stage: Secondary | ICD-10-CM | POA: Diagnosis not present

## 2019-04-14 DIAGNOSIS — H01004 Unspecified blepharitis left upper eyelid: Secondary | ICD-10-CM | POA: Diagnosis not present

## 2019-04-14 DIAGNOSIS — H524 Presbyopia: Secondary | ICD-10-CM | POA: Diagnosis not present

## 2019-04-14 DIAGNOSIS — H01001 Unspecified blepharitis right upper eyelid: Secondary | ICD-10-CM | POA: Diagnosis not present

## 2019-04-28 DIAGNOSIS — Z4509 Encounter for adjustment and management of other cardiac device: Secondary | ICD-10-CM | POA: Diagnosis not present

## 2019-04-28 DIAGNOSIS — I442 Atrioventricular block, complete: Secondary | ICD-10-CM | POA: Diagnosis not present

## 2019-04-28 DIAGNOSIS — Z8679 Personal history of other diseases of the circulatory system: Secondary | ICD-10-CM | POA: Diagnosis not present

## 2019-04-28 DIAGNOSIS — I459 Conduction disorder, unspecified: Secondary | ICD-10-CM | POA: Diagnosis not present

## 2019-04-28 DIAGNOSIS — Z950 Presence of cardiac pacemaker: Secondary | ICD-10-CM | POA: Diagnosis not present

## 2019-05-15 DIAGNOSIS — M1990 Unspecified osteoarthritis, unspecified site: Secondary | ICD-10-CM | POA: Diagnosis not present

## 2019-05-16 DIAGNOSIS — M2041 Other hammer toe(s) (acquired), right foot: Secondary | ICD-10-CM | POA: Diagnosis not present

## 2019-05-16 DIAGNOSIS — L600 Ingrowing nail: Secondary | ICD-10-CM | POA: Diagnosis not present

## 2019-05-16 DIAGNOSIS — M79674 Pain in right toe(s): Secondary | ICD-10-CM | POA: Diagnosis not present

## 2019-05-16 DIAGNOSIS — M2042 Other hammer toe(s) (acquired), left foot: Secondary | ICD-10-CM | POA: Diagnosis not present

## 2019-05-16 DIAGNOSIS — B351 Tinea unguium: Secondary | ICD-10-CM | POA: Diagnosis not present

## 2019-05-16 DIAGNOSIS — M79675 Pain in left toe(s): Secondary | ICD-10-CM | POA: Diagnosis not present

## 2019-05-22 DIAGNOSIS — R1310 Dysphagia, unspecified: Secondary | ICD-10-CM | POA: Diagnosis not present

## 2019-05-30 DIAGNOSIS — I10 Essential (primary) hypertension: Secondary | ICD-10-CM | POA: Diagnosis not present

## 2019-06-12 DIAGNOSIS — F419 Anxiety disorder, unspecified: Secondary | ICD-10-CM | POA: Diagnosis not present

## 2019-06-19 DIAGNOSIS — R64 Cachexia: Secondary | ICD-10-CM | POA: Diagnosis not present

## 2019-06-29 DIAGNOSIS — F329 Major depressive disorder, single episode, unspecified: Secondary | ICD-10-CM | POA: Diagnosis not present

## 2019-06-29 DIAGNOSIS — I110 Hypertensive heart disease with heart failure: Secondary | ICD-10-CM | POA: Diagnosis not present

## 2019-06-29 DIAGNOSIS — Z7902 Long term (current) use of antithrombotics/antiplatelets: Secondary | ICD-10-CM | POA: Diagnosis not present

## 2019-06-29 DIAGNOSIS — R1312 Dysphagia, oropharyngeal phase: Secondary | ICD-10-CM | POA: Diagnosis not present

## 2019-06-29 DIAGNOSIS — J449 Chronic obstructive pulmonary disease, unspecified: Secondary | ICD-10-CM | POA: Diagnosis not present

## 2019-06-29 DIAGNOSIS — M549 Dorsalgia, unspecified: Secondary | ICD-10-CM | POA: Diagnosis not present

## 2019-06-29 DIAGNOSIS — Z9181 History of falling: Secondary | ICD-10-CM | POA: Diagnosis not present

## 2019-06-29 DIAGNOSIS — I509 Heart failure, unspecified: Secondary | ICD-10-CM | POA: Diagnosis not present

## 2019-06-29 DIAGNOSIS — Z7982 Long term (current) use of aspirin: Secondary | ICD-10-CM | POA: Diagnosis not present

## 2019-06-29 DIAGNOSIS — M1990 Unspecified osteoarthritis, unspecified site: Secondary | ICD-10-CM | POA: Diagnosis not present

## 2019-06-29 DIAGNOSIS — I4891 Unspecified atrial fibrillation: Secondary | ICD-10-CM | POA: Diagnosis not present

## 2019-06-29 DIAGNOSIS — Z9981 Dependence on supplemental oxygen: Secondary | ICD-10-CM | POA: Diagnosis not present

## 2019-07-02 DIAGNOSIS — I110 Hypertensive heart disease with heart failure: Secondary | ICD-10-CM | POA: Diagnosis not present

## 2019-07-02 DIAGNOSIS — M549 Dorsalgia, unspecified: Secondary | ICD-10-CM | POA: Diagnosis not present

## 2019-07-02 DIAGNOSIS — J449 Chronic obstructive pulmonary disease, unspecified: Secondary | ICD-10-CM | POA: Diagnosis not present

## 2019-07-02 DIAGNOSIS — I509 Heart failure, unspecified: Secondary | ICD-10-CM | POA: Diagnosis not present

## 2019-07-02 DIAGNOSIS — R1312 Dysphagia, oropharyngeal phase: Secondary | ICD-10-CM | POA: Diagnosis not present

## 2019-07-02 DIAGNOSIS — I4891 Unspecified atrial fibrillation: Secondary | ICD-10-CM | POA: Diagnosis not present

## 2019-07-03 DIAGNOSIS — E039 Hypothyroidism, unspecified: Secondary | ICD-10-CM | POA: Diagnosis not present

## 2019-07-03 DIAGNOSIS — M1990 Unspecified osteoarthritis, unspecified site: Secondary | ICD-10-CM | POA: Diagnosis not present

## 2019-07-03 DIAGNOSIS — I509 Heart failure, unspecified: Secondary | ICD-10-CM | POA: Diagnosis not present

## 2019-07-07 DIAGNOSIS — R1312 Dysphagia, oropharyngeal phase: Secondary | ICD-10-CM | POA: Diagnosis not present

## 2019-07-07 DIAGNOSIS — J449 Chronic obstructive pulmonary disease, unspecified: Secondary | ICD-10-CM | POA: Diagnosis not present

## 2019-07-07 DIAGNOSIS — I110 Hypertensive heart disease with heart failure: Secondary | ICD-10-CM | POA: Diagnosis not present

## 2019-07-07 DIAGNOSIS — I509 Heart failure, unspecified: Secondary | ICD-10-CM | POA: Diagnosis not present

## 2019-07-07 DIAGNOSIS — I4891 Unspecified atrial fibrillation: Secondary | ICD-10-CM | POA: Diagnosis not present

## 2019-07-07 DIAGNOSIS — M549 Dorsalgia, unspecified: Secondary | ICD-10-CM | POA: Diagnosis not present

## 2019-08-25 DIAGNOSIS — I495 Sick sinus syndrome: Secondary | ICD-10-CM | POA: Diagnosis not present

## 2019-08-25 DIAGNOSIS — Z45018 Encounter for adjustment and management of other part of cardiac pacemaker: Secondary | ICD-10-CM | POA: Diagnosis not present

## 2019-09-17 DIAGNOSIS — G4700 Insomnia, unspecified: Secondary | ICD-10-CM | POA: Diagnosis not present

## 2019-09-17 DIAGNOSIS — I10 Essential (primary) hypertension: Secondary | ICD-10-CM | POA: Diagnosis not present

## 2019-09-25 DIAGNOSIS — M1990 Unspecified osteoarthritis, unspecified site: Secondary | ICD-10-CM | POA: Diagnosis not present

## 2019-10-30 DIAGNOSIS — Z45018 Encounter for adjustment and management of other part of cardiac pacemaker: Secondary | ICD-10-CM | POA: Diagnosis not present

## 2019-10-30 DIAGNOSIS — Z950 Presence of cardiac pacemaker: Secondary | ICD-10-CM | POA: Diagnosis not present

## 2019-10-30 DIAGNOSIS — I442 Atrioventricular block, complete: Secondary | ICD-10-CM | POA: Diagnosis not present

## 2019-11-20 DIAGNOSIS — Z23 Encounter for immunization: Secondary | ICD-10-CM | POA: Diagnosis not present

## 2019-12-02 DIAGNOSIS — I10 Essential (primary) hypertension: Secondary | ICD-10-CM | POA: Diagnosis not present

## 2019-12-02 DIAGNOSIS — I495 Sick sinus syndrome: Secondary | ICD-10-CM | POA: Diagnosis not present

## 2019-12-02 DIAGNOSIS — E782 Mixed hyperlipidemia: Secondary | ICD-10-CM | POA: Diagnosis not present

## 2019-12-02 DIAGNOSIS — I5032 Chronic diastolic (congestive) heart failure: Secondary | ICD-10-CM | POA: Diagnosis not present

## 2019-12-02 DIAGNOSIS — I2510 Atherosclerotic heart disease of native coronary artery without angina pectoris: Secondary | ICD-10-CM | POA: Diagnosis not present

## 2019-12-05 DIAGNOSIS — M2042 Other hammer toe(s) (acquired), left foot: Secondary | ICD-10-CM | POA: Diagnosis not present

## 2019-12-05 DIAGNOSIS — M2041 Other hammer toe(s) (acquired), right foot: Secondary | ICD-10-CM | POA: Diagnosis not present

## 2019-12-05 DIAGNOSIS — M79674 Pain in right toe(s): Secondary | ICD-10-CM | POA: Diagnosis not present

## 2019-12-05 DIAGNOSIS — B351 Tinea unguium: Secondary | ICD-10-CM | POA: Diagnosis not present

## 2019-12-05 DIAGNOSIS — L600 Ingrowing nail: Secondary | ICD-10-CM | POA: Diagnosis not present

## 2019-12-05 DIAGNOSIS — M79675 Pain in left toe(s): Secondary | ICD-10-CM | POA: Diagnosis not present

## 2019-12-11 DIAGNOSIS — M1990 Unspecified osteoarthritis, unspecified site: Secondary | ICD-10-CM | POA: Diagnosis not present

## 2019-12-22 DIAGNOSIS — Z23 Encounter for immunization: Secondary | ICD-10-CM | POA: Diagnosis not present

## 2020-01-09 DIAGNOSIS — G4700 Insomnia, unspecified: Secondary | ICD-10-CM | POA: Diagnosis not present

## 2020-01-09 DIAGNOSIS — M1990 Unspecified osteoarthritis, unspecified site: Secondary | ICD-10-CM | POA: Diagnosis not present

## 2020-01-29 DIAGNOSIS — I442 Atrioventricular block, complete: Secondary | ICD-10-CM | POA: Diagnosis not present

## 2020-01-29 DIAGNOSIS — Z4509 Encounter for adjustment and management of other cardiac device: Secondary | ICD-10-CM | POA: Diagnosis not present

## 2020-01-29 DIAGNOSIS — Z45018 Encounter for adjustment and management of other part of cardiac pacemaker: Secondary | ICD-10-CM | POA: Diagnosis not present

## 2020-09-30 DEATH — deceased
